# Patient Record
Sex: Female | Born: 1980 | Race: White | Hispanic: No | Marital: Single | Smoking: Never smoker
Health system: Southern US, Community
[De-identification: ages and names within clinical notes are randomized; demographics above are authoritative.]

## PROBLEM LIST (undated history)

## (undated) DIAGNOSIS — Z86711 Personal history of pulmonary embolism: Secondary | ICD-10-CM

## (undated) DIAGNOSIS — I872 Venous insufficiency (chronic) (peripheral): Secondary | ICD-10-CM

## (undated) DIAGNOSIS — E669 Obesity, unspecified: Secondary | ICD-10-CM

## (undated) DIAGNOSIS — E119 Type 2 diabetes mellitus without complications: Secondary | ICD-10-CM

## (undated) DIAGNOSIS — I10 Essential (primary) hypertension: Secondary | ICD-10-CM

## (undated) DIAGNOSIS — R002 Palpitations: Secondary | ICD-10-CM

## (undated) DIAGNOSIS — Z86718 Personal history of other venous thrombosis and embolism: Secondary | ICD-10-CM

## (undated) HISTORY — DX: Palpitations: R00.2

## (undated) HISTORY — DX: Personal history of pulmonary embolism: Z86.711

## (undated) HISTORY — DX: Obesity, unspecified: E66.9

## (undated) HISTORY — DX: Essential (primary) hypertension: I10

## (undated) HISTORY — DX: Type 2 diabetes mellitus without complications: E11.9

## (undated) HISTORY — DX: Venous insufficiency (chronic) (peripheral): I87.2

---

## 2017-12-19 ENCOUNTER — Emergency Department (HOSPITAL_BASED_OUTPATIENT_CLINIC_OR_DEPARTMENT_OTHER): Payer: Self-pay

## 2017-12-19 ENCOUNTER — Other Ambulatory Visit: Payer: Self-pay

## 2017-12-19 ENCOUNTER — Encounter (HOSPITAL_BASED_OUTPATIENT_CLINIC_OR_DEPARTMENT_OTHER): Payer: Self-pay | Admitting: Emergency Medicine

## 2017-12-19 ENCOUNTER — Emergency Department (HOSPITAL_BASED_OUTPATIENT_CLINIC_OR_DEPARTMENT_OTHER)
Admission: EM | Admit: 2017-12-19 | Discharge: 2017-12-19 | Disposition: A | Discharge status: Home / Self Care | Payer: Self-pay | Attending: Emergency Medicine | Admitting: Emergency Medicine

## 2017-12-19 DIAGNOSIS — I8003 Phlebitis and thrombophlebitis of superficial vessels of lower extremities, bilateral: Secondary | ICD-10-CM | POA: Insufficient documentation

## 2017-12-19 HISTORY — DX: Personal history of other venous thrombosis and embolism: Z86.718

## 2017-12-19 NOTE — ED Triage Notes (Addendum)
Patient states that she has a hx of blood clots and varicose veins  - patient recently flew from [place] to here. Patient states that she now has swelling nad pain and "more noticible veins" since she got back  - patient states that she has never needed to be on blood thinners they just tell her to take motrin. Patient also reports that she would like her right leg checked as she thinks "that one" is growing"

## 2017-12-19 NOTE — ED Provider Notes (Signed)
[facility] EMERGENCY DEPARTMENT Provider Note   CSN: [id number] Arrival date & time: 12/19/17  1607     History   Chief Complaint Chief Complaint  Patient presents with  . Leg Pain    HPI Sabrina Mclaughlin is a 37 y.o. female.  HPI Patient has history of thrombophlebitis in her lower legs.  The right leg has recently been getting more painful.  Never had DVT.  Recent flight to [place] and now both legs have more swelling both at painful varices and diffusely.  No fever.  No chest pain.  No trouble breathing.  Her trip was because her mother died unexpectedly. Past Medical History:  Diagnosis Date  . History of DVT (deep vein thrombosis)     There are no active problems to display for this patient.   History reviewed. No pertinent surgical history.  OB History    No data available       Home Medications    Prior to Admission medications   Not on File    Family History History reviewed. No pertinent family history.  Social History Social History   Tobacco Use  . Smoking status: Never Smoker  . Smokeless tobacco: Never Used  Substance Use Topics  . Alcohol use: No    Frequency: Never  . Drug use: No     Allergies   Patient has no known allergies.   Review of Systems Review of Systems  Constitutional: Negative for appetite change and fever.  Respiratory: Negative for shortness of breath.   Cardiovascular: Positive for leg swelling. Negative for chest pain.  Gastrointestinal: Negative for abdominal pain.  Genitourinary: Negative for flank pain.  Musculoskeletal: Negative for back pain.  Skin: Negative for rash.  Neurological: Negative for syncope.  Hematological: Does not bruise/bleed easily.  Psychiatric/Behavioral: Negative for confusion.     Physical Exam Updated Vital Signs BP (!) 144/94 (BP Location: Right Arm)   Pulse 90   Temp 99.2 F (37.3 C) (Oral)   Resp 18   Ht 5\' 5"  (1.651 m)   Wt 104.3 kg (230 lb)   LMP 11/26/2017    SpO2 98%   BMI 38.27 kg/m   Physical Exam  Constitutional: She appears well-developed.  HENT:  Head: Atraumatic.  Eyes: EOM are normal.  Cardiovascular: Normal rate.  Pulmonary/Chest: Effort normal.  Abdominal: There is no tenderness.  Musculoskeletal:  Some tender varicose veins on bilateral lower legs.  Bilateral.  Some darkening of the skin in the area.  Slight edema of the lower legs also.  Good capillary refill.  Skin: Skin is warm.     ED Treatments / Results  Labs (all labs ordered are listed, but only abnormal results are displayed) Labs Reviewed - No data to display  EKG  EKG Interpretation None       Radiology US Venous Img Lower Bilateral  Result Date: 12/19/2017 CLINICAL DATA:  Recent 5 hour plane flight, BILATERAL lower extremity pain, swelling, and redness below the knee for 3 weeks, history of deep venous thrombosis EXAM: BILATERAL LOWER EXTREMITY VENOUS DOPPLER ULTRASOUND TECHNIQUE: Gray-scale sonography with graded compression, as well as color Doppler and duplex ultrasound were performed to evaluate the lower extremity deep venous systems from the level of the common femoral vein and including the common femoral, femoral, profunda femoral, popliteal and calf veins including the posterior tibial, peroneal and gastrocnemius veins when visible. The superficial great saphenous vein was also interrogated. Spectral Doppler was utilized to evaluate flow at rest and with  distal augmentation maneuvers in the common femoral, femoral and popliteal veins. COMPARISON:  None. FINDINGS: RIGHT LOWER EXTREMITY Common Femoral Vein: No evidence of thrombus. Normal compressibility, respiratory phasicity and response to augmentation. Saphenofemoral Junction: No evidence of thrombus. Normal compressibility and flow on color Doppler imaging. Profunda Femoral Vein: No evidence of thrombus. Normal compressibility and flow on color Doppler imaging. Femoral Vein: No evidence of thrombus.  Normal compressibility, respiratory phasicity and response to augmentation. Popliteal Vein: No evidence of thrombus. Normal compressibility, respiratory phasicity and response to augmentation. Calf Veins: No evidence of thrombus. Normal compressibility and flow on color Doppler imaging. Superficial Great Saphenous Vein: No evidence of thrombus. Normal compressibility. Venous Reflux:  None. Other Findings: Thrombus is identified within dilated subcutaneous vein within the subcutaneous tissues of the lateral inferior RIGHT lower leg compatible with superficial thrombophlebitis. LEFT LOWER EXTREMITY Common Femoral Vein: No evidence of thrombus. Normal compressibility, respiratory phasicity and response to augmentation. Saphenofemoral Junction: No evidence of thrombus. Normal compressibility and flow on color Doppler imaging. Profunda Femoral Vein: No evidence of thrombus. Normal compressibility and flow on color Doppler imaging. Femoral Vein: No evidence of thrombus. Normal compressibility, respiratory phasicity and response to augmentation. Popliteal Vein: No evidence of thrombus. Normal compressibility, respiratory phasicity and response to augmentation. Calf Veins: No evidence of thrombus. Normal compressibility and flow on color Doppler imaging. Superficial Great Saphenous Vein: No evidence of thrombus. Normal compressibility. Venous Reflux:  None. Other Findings: Large thrombosed superficial venous varicosities are identified at the medial LEFT calf. IMPRESSION: No evidence of deep venous thrombosis in either lower extremity. Thrombosed superficial venous varicosities in the lower legs bilaterally compatible with superficial thrombophlebitis. Electronically Signed   By: Ulyses SouthwardMark  Boles M.D.   On: 12/19/2017 19:58    Procedures Procedures (including critical care time)  Medications Ordered in ED Medications - No data to display   Initial Impression / Assessment and Plan / ED Course  I have reviewed the  triage vital signs and the nursing notes.  Pertinent labs & imaging results that were available during my care of the patient were reviewed by me and considered in my medical decision making (see chart for details).     Patient presents with swelling in her legs.  History of superficial thrombophlebitis.  Doppler does show only superficial thrombophlebitis.  Doubt pulmonary embolism or DVT.  Will treat symptomatically with anti-inflammatories and warm compresses.  Discharge home.  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

## 2017-12-19 NOTE — ED Notes (Signed)
PT discharged to home with family. NAD. 

## 2021-05-31 ENCOUNTER — Other Ambulatory Visit: Payer: Self-pay

## 2021-05-31 ENCOUNTER — Emergency Department (HOSPITAL_BASED_OUTPATIENT_CLINIC_OR_DEPARTMENT_OTHER): Payer: Medicaid Other

## 2021-05-31 ENCOUNTER — Inpatient Hospital Stay (HOSPITAL_BASED_OUTPATIENT_CLINIC_OR_DEPARTMENT_OTHER)
Admission: EM | Admit: 2021-05-31 | Discharge: 2021-06-04 | DRG: 175 | Disposition: A | Discharge status: Home / Self Care | Payer: Medicaid Other | Attending: Internal Medicine | Admitting: Internal Medicine

## 2021-05-31 ENCOUNTER — Encounter (HOSPITAL_BASED_OUTPATIENT_CLINIC_OR_DEPARTMENT_OTHER): Payer: Self-pay | Admitting: *Deleted

## 2021-05-31 DIAGNOSIS — I2602 Saddle embolus of pulmonary artery with acute cor pulmonale: Secondary | ICD-10-CM | POA: Diagnosis not present

## 2021-05-31 DIAGNOSIS — E669 Obesity, unspecified: Secondary | ICD-10-CM | POA: Diagnosis present

## 2021-05-31 DIAGNOSIS — E876 Hypokalemia: Secondary | ICD-10-CM | POA: Diagnosis present

## 2021-05-31 DIAGNOSIS — I2609 Other pulmonary embolism with acute cor pulmonale: Secondary | ICD-10-CM

## 2021-05-31 DIAGNOSIS — J9601 Acute respiratory failure with hypoxia: Secondary | ICD-10-CM | POA: Diagnosis present

## 2021-05-31 DIAGNOSIS — I10 Essential (primary) hypertension: Secondary | ICD-10-CM | POA: Diagnosis present

## 2021-05-31 DIAGNOSIS — I824Y2 Acute embolism and thrombosis of unspecified deep veins of left proximal lower extremity: Secondary | ICD-10-CM | POA: Diagnosis present

## 2021-05-31 DIAGNOSIS — R7303 Prediabetes: Secondary | ICD-10-CM | POA: Diagnosis present

## 2021-05-31 DIAGNOSIS — I2699 Other pulmonary embolism without acute cor pulmonale: Secondary | ICD-10-CM | POA: Diagnosis present

## 2021-05-31 DIAGNOSIS — E079 Disorder of thyroid, unspecified: Secondary | ICD-10-CM | POA: Diagnosis present

## 2021-05-31 DIAGNOSIS — Z20822 Contact with and (suspected) exposure to covid-19: Secondary | ICD-10-CM | POA: Diagnosis present

## 2021-05-31 DIAGNOSIS — I82462 Acute embolism and thrombosis of left calf muscular vein: Secondary | ICD-10-CM | POA: Diagnosis present

## 2021-05-31 DIAGNOSIS — I959 Hypotension, unspecified: Secondary | ICD-10-CM | POA: Diagnosis present

## 2021-05-31 DIAGNOSIS — Z86718 Personal history of other venous thrombosis and embolism: Secondary | ICD-10-CM | POA: Diagnosis not present

## 2021-05-31 DIAGNOSIS — Z6837 Body mass index (BMI) 37.0-37.9, adult: Secondary | ICD-10-CM | POA: Diagnosis not present

## 2021-05-31 DIAGNOSIS — D72829 Elevated white blood cell count, unspecified: Secondary | ICD-10-CM | POA: Diagnosis present

## 2021-05-31 DIAGNOSIS — Z86711 Personal history of pulmonary embolism: Secondary | ICD-10-CM | POA: Diagnosis present

## 2021-05-31 HISTORY — DX: Hypokalemia: E87.6

## 2021-05-31 HISTORY — DX: Hypotension, unspecified: I95.9

## 2021-05-31 LAB — COMPREHENSIVE METABOLIC PANEL
ALT: 17 U/L (ref 0–44)
AST: 24 U/L (ref 15–41)
Albumin: 4.1 g/dL (ref 3.5–5.0)
Alkaline Phosphatase: 71 U/L (ref 38–126)
Anion gap: 13 (ref 5–15)
BUN: 20 mg/dL (ref 6–20)
CO2: 22 mmol/L (ref 22–32)
Calcium: 9 mg/dL (ref 8.9–10.3)
Chloride: 98 mmol/L (ref 98–111)
Creatinine, Ser: 0.92 mg/dL (ref 0.44–1.00)
GFR, Estimated: >60 mL/min (ref >60)
Glucose, Bld: 210 mg/dL — ABNORMAL HIGH (ref 70–99)
Potassium: 3.1 mmol/L — ABNORMAL LOW (ref 3.5–5.1)
Sodium: 133 mmol/L — ABNORMAL LOW (ref 135–145)
Total Bilirubin: 1.3 mg/dL — ABNORMAL HIGH (ref 0.3–1.2)
Total Protein: 8.4 g/dL — ABNORMAL HIGH (ref 6.5–8.1)

## 2021-05-31 LAB — CBC
HCT: 42 % (ref 36.0–46.0)
Hemoglobin: 14.8 g/dL (ref 12.0–15.0)
MCH: 31.6 pg (ref 26.0–34.0)
MCHC: 35.2 g/dL (ref 30.0–36.0)
MCV: 89.7 fL (ref 80.0–100.0)
Platelets: 375 10*3/uL (ref 150–400)
RBC: 4.68 MIL/uL (ref 3.87–5.11)
RDW: 11.9 % (ref 11.5–15.5)
WBC: 15.1 10*3/uL — ABNORMAL HIGH (ref 4.0–10.5)
nRBC: 0 % (ref 0.0–0.2)

## 2021-05-31 LAB — GLUCOSE, CAPILLARY: Glucose-Capillary: 167 mg/dL — ABNORMAL HIGH (ref 70–99)

## 2021-05-31 LAB — BRAIN NATRIURETIC PEPTIDE: B Natriuretic Peptide: 27.3 pg/mL (ref 0.0–100.0)

## 2021-05-31 LAB — PROTIME-INR
INR: 1 (ref 0.8–1.2)
Prothrombin Time: 13 seconds (ref 11.4–15.2)

## 2021-05-31 LAB — PREGNANCY, URINE: Preg Test, Ur: NEGATIVE

## 2021-05-31 LAB — RESP PANEL BY RT-PCR (FLU A&B, COVID) ARPGX2
Influenza A by PCR: NEGATIVE
Influenza B by PCR: NEGATIVE
SARS Coronavirus 2 by RT PCR: NEGATIVE

## 2021-05-31 LAB — APTT: aPTT: 24 seconds (ref 24–36)

## 2021-05-31 LAB — TROPONIN I (HIGH SENSITIVITY): Troponin I (High Sensitivity): 36 ng/L — ABNORMAL HIGH (ref <18)

## 2021-05-31 MED ORDER — ALTEPLASE 100 MG IV SOLR
INTRAVENOUS | Status: AC
  Filled 2021-05-31: qty 100

## 2021-05-31 MED ORDER — IOHEXOL 350 MG/ML SOLN
100.0000 mL | Freq: Once | INTRAVENOUS | Status: AC | PRN
  Administered 2021-05-31: 100 mL via INTRAVENOUS

## 2021-05-31 MED ORDER — SODIUM CHLORIDE 0.9 % IV BOLUS
1000.0000 mL | Freq: Once | INTRAVENOUS | Status: AC
  Administered 2021-05-31: 1000 mL via INTRAVENOUS

## 2021-05-31 MED ORDER — TENECTEPLASE 50 MG IV KIT
PACK | INTRAVENOUS | Status: AC
  Filled 2021-05-31: qty 10

## 2021-05-31 MED ORDER — HEPARIN (PORCINE) 25000 UT/250ML-% IV SOLN
1400.0000 [IU]/h | INTRAVENOUS | Status: DC
  Administered 2021-05-31: 20:00:00 1300 [IU]/h via INTRAVENOUS
  Filled 2021-05-31 (×2): qty 250

## 2021-05-31 MED ORDER — CHLORHEXIDINE GLUCONATE CLOTH 2 % EX PADS
6.0000 | MEDICATED_PAD | Freq: Every day | CUTANEOUS | Status: DC
  Administered 2021-05-31 – 2021-06-03 (×4): 6 via TOPICAL

## 2021-05-31 MED ORDER — SODIUM CHLORIDE 0.9 % IV SOLN: Freq: Once | INTRAVENOUS | Status: AC

## 2021-05-31 MED ORDER — NOREPINEPHRINE 4 MG/250ML-% IV SOLN
INTRAVENOUS | Status: AC
  Filled 2021-05-31: qty 250

## 2021-05-31 MED ORDER — HEPARIN BOLUS VIA INFUSION
5000.0000 [IU] | Freq: Once | INTRAVENOUS | Status: AC
  Administered 2021-05-31: 5000 [IU] via INTRAVENOUS

## 2021-05-31 NOTE — ED Notes (Signed)
Dr. Reese at bedside.

## 2021-05-31 NOTE — ED Notes (Signed)
ED Provider at bedside. 

## 2021-05-31 NOTE — ED Notes (Signed)
NS started wide open

## 2021-05-31 NOTE — ED Triage Notes (Signed)
C/o sudden onset of hear palpitations with SOB, chest pressure and nausea x 45 mins ago

## 2021-05-31 NOTE — ED Provider Notes (Signed)
[facility] EMERGENCY DEPARTMENT Provider Note   CSN: [id number] Arrival date & time: 05/31/21  1850     History Chief Complaint  Patient presents with   Loss of Consciousness    Sabrina Mclaughlin is a 40 y.o. female without significant past medical hx of superficial venous thrombosis who presents to the emergency department with complaints of syncopal episode which occurred about 1 hour prior to arrival.  Patient states that she was in the kitchen when she started to feel lightheaded with palpitations, shortness of breath, chest tightness, and nausea.  She went and sat down on the couch and subsequently passed out.  She did not hit her head.  She came back to with continued symptoms and diaphoresis prompting emergency department visit.  She states that she has had some bilateral lower extremity discomfort consistent with prior superficial venous thrombosis.  She has not had a prior DVT or PE or been on anticoagulation.  She denies fever, vomiting, hemoptysis, recent surgery/trauma, recent long travel, hormone use, or hx of cancer.    HPI     Past Medical History:  Diagnosis Date   History of DVT (deep vein thrombosis)     There are no problems to display for this patient.   History reviewed. No pertinent surgical history.   OB History   No obstetric history on file.     No family history on file.  Social History   Tobacco Use   Smoking status: Never   Smokeless tobacco: Never  Substance Use Topics   Alcohol use: No   Drug use: No    Home Medications Prior to Admission medications   Not on File    Allergies    Patient has no known allergies.  Review of Systems   Review of Systems  Constitutional:  Positive for diaphoresis. Negative for chills and fever.  Respiratory:  Positive for shortness of breath.   Cardiovascular:  Positive for chest pain and palpitations.  Gastrointestinal:  Positive for nausea. Negative for abdominal pain and vomiting.   Musculoskeletal:  Positive for myalgias.  Neurological:  Positive for syncope.  All other systems reviewed and are negative.  Physical Exam Updated Vital Signs BP (!) 127/94 (BP Location: Left Arm)   Pulse (!) 107   Temp 98.4 F (36.9 C) (Oral)   Resp (!) 24   Ht  (1.651 m)   Wt 96.2 kg   LMP 05/31/2021   SpO2 (!) 81%   BMI 35.28 kg/m   Physical Exam Vitals and nursing note reviewed.  Constitutional:      Appearance: She is ill-appearing.  HENT:     Head: Normocephalic and atraumatic.  Eyes:     Comments: PERRL.   Cardiovascular:     Rate and Rhythm: Regular rhythm. Tachycardia present.     Pulses:          Radial pulses are 2+ on the right side and 2+ on the left side.       Dorsalis pedis pulses are 2+ on the right side and 2+ on the left side.  Pulmonary:     Effort: Tachypnea present.     Breath sounds: No wheezing, rhonchi or rales.     Comments: Hypoxic into the 70s on RA- requiring 5L via [place] to maintain SpO2 @ 92%. Clear lungs.  Abdominal:     Tenderness: There is no abdominal tenderness. There is no guarding or rebound.  Musculoskeletal:     Comments: LE swelling bilaterally. No significant  calf tenderness.   Skin:    General: Skin is moist.     Comments: Diaphoretic.   Neurological:     Mental Status: She is alert.  Psychiatric:        Behavior: Behavior is cooperative.    ED Results / Procedures / Treatments   Labs (all labs ordered are listed, but only abnormal results are displayed) Labs Reviewed  CBC - Abnormal; Notable for the following components:      Result Value   WBC 15.1 (*)    All other components within normal limits  COMPREHENSIVE METABOLIC PANEL - Abnormal; Notable for the following components:   Sodium 133 (*)    Potassium 3.1 (*)    Glucose, Bld 210 (*)    Total Protein 8.4 (*)    Total Bilirubin 1.3 (*)    All other components within normal limits  TROPONIN I (HIGH SENSITIVITY) - Abnormal; Notable for the following  components:   Troponin I (High Sensitivity) 36 (*)    All other components within normal limits  RESP PANEL BY RT-PCR (FLU A&B, COVID) ARPGX2  APTT  PROTIME-INR  PREGNANCY, URINE  HEPARIN LEVEL (UNFRACTIONATED)    EKG EKG Interpretation  Date/Time:  Wednesday May 31 2021 19:07:54 EDT Ventricular Rate:  111 PR Interval:  166 QRS Duration: 99 QT Interval:  340 QTC Calculation: 462 R Axis:   64 Text Interpretation: Sinus tachycardia Borderline repolarization abnormality Confirmed by Tilden Fossa [phone]) on 05/31/2021 7:18:28 PM  Radiology CT Head Wo Contrast  Result Date: 05/31/2021 CLINICAL DATA:  Neck mass EXAM: CT HEAD WITHOUT CONTRAST TECHNIQUE: Contiguous axial images were obtained from the base of the skull through the vertex without intravenous contrast. COMPARISON:  CT chest 05/31/2021 FINDINGS: Brain: No evidence of acute infarction, hemorrhage, hydrocephalus, extra-axial collection or mass lesion/mass effect. Vascular: Limited assessment due to recent intravascular contrast administration. No unexpected calcification. Skull: Normal. Negative for fracture or focal lesion. Sinuses/Orbits: No acute finding. Other: None IMPRESSION: Negative non contrasted CT appearance of the brain. Electronically Signed   By: Jasmine Pang M.D.   On: 05/31/2021 21:33   CT Angio Chest PE W/Cm &/Or Wo Cm  Result Date: 05/31/2021 CLINICAL DATA:  Loss of consciousness EXAM: CT ANGIOGRAPHY CHEST WITH CONTRAST TECHNIQUE: Multidetector CT imaging of the chest was performed using the standard protocol during bolus administration of intravenous contrast. Multiplanar CT image reconstructions and MIPs were obtained to evaluate the vascular anatomy. CONTRAST:  OMNIPAQUE IOHEXOL 350 MG/ML SOLN COMPARISON:  Chest x-ray 05/31/2021 FINDINGS: Cardiovascular: Satisfactory opacification of the pulmonary arteries to the segmental level. Extensive bilateral acute pulmonary emboli, involves the distal main  pulmonary arteries bilaterally. Extension of thrombus into right upper lobe segmental vessels, inter lobar pulmonary artery, right middle lobe segmental vessels and right lower lobe segmental and subsegmental vessels. Thrombus on the left extends into left upper and lower lobe segmental and subsegmental vessels. Positive for right heart strain with RV LV ratio of 2. Left ventricular walls appear thickened. Cardiac size is normal. No pericardial effusion. Mediastinum/Nodes: Midline trachea. 4.3 cm heterogenous mass at the thyroid isthmus and right lobe. No suspicious adenopathy. Esophagus within normal limits Lungs/Pleura: Lungs are clear. No pleural effusion or pneumothorax. Upper Abdomen: No acute abnormality. Musculoskeletal: No chest wall abnormality. No acute or significant osseous findings. Review of the MIP images confirms the above findings. IMPRESSION: Positive for acute bilateral PE with CT evidence of right heart strain (RV/LV Ratio = 2.0) consistent with at least submassive (intermediate risk)  PE. The presence of right heart strain has been associated with an increased risk of morbidity and mortality. Please refer to the "PE Focused" order set in EPIC. 4.3 cm heterogenous thyroid mass. Recommend thyroid US (ref: J Am Coll Radiol. 2015 Feb;12(2): 143-50). Critical Value/emergent results were called by telephone at the time of interpretation on 05/31/2021 at 8:36 pm to provider [facility] , who verbally acknowledged these results. Electronically Signed   By: Jasmine Pang M.D.   On: 05/31/2021 20:36   US Venous Img Lower Bilateral  Result Date: 05/31/2021 CLINICAL DATA:  Bilateral lower extremity edema EXAM: BILATERAL LOWER EXTREMITY VENOUS DOPPLER ULTRASOUND TECHNIQUE: Gray-scale sonography with graded compression, as well as color Doppler and duplex ultrasound were performed to evaluate the lower extremity deep venous systems from the level of the common femoral vein and including the common  femoral, femoral, profunda femoral, popliteal and calf veins including the posterior tibial, peroneal and gastrocnemius veins when visible. The superficial great saphenous vein was also interrogated. Spectral Doppler was utilized to evaluate flow at rest and with distal augmentation maneuvers in the common femoral, femoral and popliteal veins. COMPARISON:  12/19/2017 FINDINGS: RIGHT LOWER EXTREMITY Common Femoral Vein: No evidence of thrombus. Normal compressibility, respiratory phasicity and response to augmentation. Saphenofemoral Junction: No evidence of thrombus. Normal compressibility and flow on color Doppler imaging. Profunda Femoral Vein: No evidence of thrombus. Normal compressibility and flow on color Doppler imaging. Femoral Vein: No evidence of thrombus. Normal compressibility, respiratory phasicity and response to augmentation. Popliteal Vein: No evidence of thrombus. Normal compressibility, respiratory phasicity and response to augmentation. Calf Veins: No evidence of thrombus. Normal compressibility and flow on color Doppler imaging. Superficial Great Saphenous Vein: No evidence of thrombus. Normal compressibility. Venous Reflux:  None. Other Findings:  None. LEFT LOWER EXTREMITY Common Femoral Vein: No evidence of thrombus. Normal compressibility, respiratory phasicity and response to augmentation. Saphenofemoral Junction: No evidence of thrombus. Normal compressibility and flow on color Doppler imaging. Profunda Femoral Vein: No evidence of thrombus. Normal compressibility and flow on color Doppler imaging. Femoral Vein: No evidence of thrombus. Normal compressibility, respiratory phasicity and response to augmentation. Popliteal Vein: No evidence of thrombus. Normal compressibility, respiratory phasicity and response to augmentation. Calf Veins: The left peroneal and posterior tibial veins are patent on the presented images. There is, however, nonocclusive thrombus seen within several gastrocnemius  veins of the left calf. Superficial Great Saphenous Vein: There is nonocclusive thrombus within the central greater saphenous vein roughly 12 mm from the saphenofemoral junction. There is no extension of this thrombus into the common femoral vein itself. There is occlusive, acute expansile thrombus within the greater saphenous vein slightly more distally within the proximal thigh. The greater saphenous vein remains thrombosed to at least the level of the knee. Venous Reflux:  None. Other Findings:  None. IMPRESSION: No femoropopliteal DVT identified. Superficial venous thrombosis of the greater saphenous vein to within 12 mm of the saphenofemoral junction. No extension of thrombus identified through the saphenofemoral junction itself. Infrapopliteal DVT involving the left gastrocnemius veins. No extension into the popliteal vein. Electronically Signed   By: Helyn Numbers MD   On: 05/31/2021 21:52   DG Chest Portable 1 View  Result Date: 05/31/2021 CLINICAL DATA:  Heart palpitation EXAM: PORTABLE CHEST 1 VIEW COMPARISON:  None. FINDINGS: The heart size and mediastinal contours are within normal limits. Both lungs are clear. The visualized skeletal structures are unremarkable. IMPRESSION: No active disease. Electronically Signed   By: Jasmine Pang  M.D.   On: 05/31/2021 19:38    Procedures .Critical Care  Date/Time: 05/31/2021 10:38 PM Performed by: Cherly AndersonPetrucelli, Deari Sessler R, PA-C Authorized by: Cherly AndersonPetrucelli, Debbe Crumble R, PA-C    CRITICAL CARE Performed by: Harvie HeckSamantha Lielle Vandervort   Total critical care time: 55 minutes  Critical care time was exclusive of separately billable procedures and treating other patients.  Critical care was necessary to treat or prevent imminent or life-threatening deterioration.  Critical care was time spent personally by me on the following activities: development of treatment plan with patient and/or surrogate as well as nursing, discussions with consultants, evaluation of  patient's response to treatment, examination of patient, obtaining history from patient or surrogate, ordering and performing treatments and interventions, ordering and review of laboratory studies, ordering and review of radiographic studies, pulse oximetry and re-evaluation of patient's condition.  Medications Ordered in ED Medications  heparin ADULT infusion 100 units/mL (25000 units/24150mL) (1,300 Units/hr Intravenous New Bag/Given 05/31/21 1944)  heparin bolus via infusion 5,000 Units (5,000 Units Intravenous Bolus from Bag 05/31/21 1944)  iohexol (OMNIPAQUE) 350 MG/ML injection 100 mL (100 mLs Intravenous Contrast Given 05/31/21 2006)  0.9 %  sodium chloride infusion ( Intravenous New Bag/Given 05/31/21 2202)  sodium chloride 0.9 % bolus 1,000 mL (0 mLs Intravenous Stopped 05/31/21 2155)    ED Course  I have reviewed the triage vital signs and the nursing notes.  Pertinent labs & imaging results that were available during my care of the patient were reviewed by me and considered in my medical decision making (see chart for details).    MDM Rules/Calculators/A&P                           Patient presents to the ED with complaints of syncope with palpitations, chest tightness, & dyspnea. On arrival patient patient is hypoxic, tachycardic, and tachypneic. Requiring 5L via [place] to maintain SPO2 > 92% on RA. Clinical concern for pulmonary embolism, appropriate labs and imaging ordered.  Patient quickly staffed with supervising physician Dr. Madilyn Hookees who is in agreement with starting heparin. Additional DDX- acs, pneumothorax, pneumonia, covid, arrhythmia, dissection.   Additional history obtained:  Additional history obtained from chart review & nursing note review.   19:34: CONSULT: Discussed with RPH John- heparin dosing being placed now.   19:40: Heparin being started at this time.   EKG: Sinus tachycardia Borderline repolarization abnormality Lab Tests:  I Ordered, reviewed, and interpreted  labs, which included:  CBC: Leukocytosis at 15.1 CMP: Mild hypokalemia, hyponatremia, and hyperglycemia.  T bili is also mildly elevated. PT/INR/APTT: Within normal limits Troponin: Elevated at 36.  Imaging Studies ordered:  I ordered imaging studies which included CXR, CTA of the chest, & bilateral venous duplexes, I independently reviewed, formal radiology impression as below and above.   CXR: No active disease.   19:53: RE-EVAL: Patient became lightheaded, blood pressure dropped to 81/54, 1 L normal saline bolus started.  Patient escalated to 6 L via nasal cannula given sats in the 90s on 5 L.  Repeat blood pressure is improved.  Patient to be taken to CT scan at this time.  Myself and attending Dr. Madilyn Hookees personally reviewed CT imaging, findings of large pulmonary emboli and thyroid mass.  Consult placed to critical care.  20:32: CONSULT: Attending Dr. Madilyn Hookees spoke with intensivist Dr. Alvin CritchleyJeong-accepts admission, can hold off on tPA at this time, we [facility].  CTA: Positive for  acute bilateral PE with CT evidence of right heart strain (RV/LV Ratio = 2.0) consistent with at least submassive (intermediate risk) PE. The presence of right heart strain has been associated with an increased risk of morbidity and mortality. Please refer to the "PE Focused" order set in EPIC. 4.3 cm heterogenous thyroid mass. Recommend thyroid  Venous duplex studies: No femoropopliteal DVT identified. Superficial venous thrombosis of the greater saphenous vein to within 12 mm of the saphenofemoral junction. No extension of thrombus identified through the saphenofemoral junction itself. Infrapopliteal DVT involving the left gastrocnemius veins. No extension into the popliteal vein  20:49: CONSULT: Re-discussed with Dr. Ardeth Perfect- recommends head CT if able to obtain prior to transfer to evaluate for any brain masses/mets if patient potentially received tpa. Confirmed with  secretary that [facility] has not been sent for pick up yet, radiology subsequently informed of CT head order.   Patient's blood pressure is improving CT head wo: Negative non contrasted CT appearance of the brain.   Blood pressure (!) 138/102, pulse (!) 109, temperature 98.5 F (36.9 C), temperature source Oral, resp. rate (!) 26, height 5\' 5"  (1.651 m), weight 96.2 kg, last menstrual period 05/31/2021, SpO2 100 %.  22:09: [facility] in the ED for transport.   This is a shared visit with supervising physician Dr. 06/02/2021 who has independently evaluated patient & provided guidance in evaluation/management/disposition, in agreement with care   Portions of this note were generated with Dragon dictation software. Dictation errors may occur despite best attempts at proofreading.  Final Clinical Impression(s) / ED Diagnoses Final diagnoses:  Acute pulmonary embolism with acute cor pulmonale, unspecified pulmonary embolism type [facility])    Rx / DC Orders ED Discharge Orders     None        [facility] 05/31/21 2239    2240, MD 06/02/21 [phone]

## 2021-05-31 NOTE — Progress Notes (Signed)
eLink Physician-Brief Progress Note Patient Name: Sabrina Mclaughlin DOB: [birth year] MRN: [id number]   Date of Service  05/31/2021  HPI/Events of Note  Patient admitted via ED following a syncopal episode at home, CTA chest positive for hemodynamically significant bilateral PE  eICU Interventions  New Patient Evaluation, Heparin gtt infusing.        Migdalia Dk 05/31/2021, 11:33 PM

## 2021-05-31 NOTE — Progress Notes (Signed)
Patient's SPO2 is 90% on 6 liter nasal cannula.  Placed patient on 100% non rebreather.  Patient's SPO2 increased to 100%.  Patient states that she feel better with non rebreather on.  RT will continue to monitor.

## 2021-05-31 NOTE — Progress Notes (Signed)
Patient' SPO2 81% in Triage.  Placed patient on 6 liter nasal cannula with humidity.  SPO2 increased to 98%.  RT will continue to monitor.

## 2021-05-31 NOTE — Progress Notes (Signed)
ANTICOAGULATION CONSULT NOTE - Initial Consult  Pharmacy Consult for heparin Indication: pulmonary embolus  No Known Allergies  Patient Measurements: Height: 5\' 5"  (165.1 cm) Weight: 96.2 kg (212 lb) IBW/kg (Calculated) : 57 Heparin Dosing Weight: 78.7kg  Vital Signs: Temp: 98.4 F (36.9 C) (07/20 1858) Temp Source: Oral (07/20 1858) BP: 127/94 (07/20 1858) Pulse Rate: 107 (07/20 1858)  Labs: Recent Labs    05/31/21 1912  HGB 14.8  HCT 42.0  PLT 375  APTT 24  LABPROT 13.0  INR 1.0    CrCl cannot be calculated (No successful lab value found.).   Medical History: Past Medical History:  Diagnosis Date   History of DVT (deep vein thrombosis)     Assessment: 34 YOF presenting with SOB, CP, suspect PE and pharmacy consulted to initiate heparin gtt for PE r/o.  She is not on anticoagulation PTA  Goal of Therapy:  Heparin level 0.3-0.7 units/ml Monitor platelets by anticoagulation protocol: Yes   Plan:  Heparin 5000 units IV x 1, and gtt at 1300 units/hr F/u 6 hour heparin level F/u PE workup and long term [facility] plan  [facility], PharmD Clinical Pharmacist ED Pharmacist Phone # [phone] 05/31/2021 7:37 PM

## 2021-06-01 ENCOUNTER — Other Ambulatory Visit (HOSPITAL_COMMUNITY): Payer: Medicaid Other

## 2021-06-01 ENCOUNTER — Inpatient Hospital Stay (HOSPITAL_COMMUNITY): Payer: Medicaid Other

## 2021-06-01 DIAGNOSIS — E876 Hypokalemia: Secondary | ICD-10-CM

## 2021-06-01 DIAGNOSIS — E079 Disorder of thyroid, unspecified: Secondary | ICD-10-CM

## 2021-06-01 DIAGNOSIS — J9601 Acute respiratory failure with hypoxia: Secondary | ICD-10-CM

## 2021-06-01 DIAGNOSIS — I2609 Other pulmonary embolism with acute cor pulmonale: Secondary | ICD-10-CM

## 2021-06-01 DIAGNOSIS — I2602 Saddle embolus of pulmonary artery with acute cor pulmonale: Secondary | ICD-10-CM

## 2021-06-01 LAB — URINALYSIS, ROUTINE W REFLEX MICROSCOPIC
Bilirubin Urine: NEGATIVE
Glucose, UA: NEGATIVE mg/dL
Ketones, ur: 20 mg/dL — AB
Leukocytes,Ua: NEGATIVE
Nitrite: NEGATIVE
Protein, ur: NEGATIVE mg/dL
Specific Gravity, Urine: 1.045 — ABNORMAL HIGH (ref 1.005–1.030)
pH: 6 (ref 5.0–8.0)

## 2021-06-01 LAB — CBC
HCT: 37.1 % (ref 36.0–46.0)
HCT: 41.7 % (ref 36.0–46.0)
Hemoglobin: 13.2 g/dL (ref 12.0–15.0)
Hemoglobin: 14 g/dL (ref 12.0–15.0)
MCH: 32.1 pg (ref 26.0–34.0)
MCH: 31.5 pg (ref 26.0–34.0)
MCHC: 35.6 g/dL (ref 30.0–36.0)
MCHC: 33.6 g/dL (ref 30.0–36.0)
MCV: 90.3 fL (ref 80.0–100.0)
MCV: 93.7 fL (ref 80.0–100.0)
Platelets: 293 10*3/uL (ref 150–400)
Platelets: 268 10*3/uL (ref 150–400)
RBC: 4.11 MIL/uL (ref 3.87–5.11)
RBC: 4.45 MIL/uL (ref 3.87–5.11)
RDW: 12.1 % (ref 11.5–15.5)
RDW: 12.4 % (ref 11.5–15.5)
WBC: 11 10*3/uL — ABNORMAL HIGH (ref 4.0–10.5)
WBC: 17.7 10*3/uL — ABNORMAL HIGH (ref 4.0–10.5)
nRBC: 0 % (ref 0.0–0.2)
nRBC: 0 % (ref 0.0–0.2)

## 2021-06-01 LAB — PROTIME-INR
INR: 1.5 — ABNORMAL HIGH (ref 0.8–1.2)
Prothrombin Time: 17.9 seconds — ABNORMAL HIGH (ref 11.4–15.2)

## 2021-06-01 LAB — BASIC METABOLIC PANEL
Anion gap: 8 (ref 5–15)
BUN: 12 mg/dL (ref 6–20)
CO2: 22 mmol/L (ref 22–32)
Calcium: 8.4 mg/dL — ABNORMAL LOW (ref 8.9–10.3)
Chloride: 107 mmol/L (ref 98–111)
Creatinine, Ser: 0.72 mg/dL (ref 0.44–1.00)
GFR, Estimated: >60 mL/min (ref >60)
Glucose, Bld: 146 mg/dL — ABNORMAL HIGH (ref 70–99)
Potassium: 3.7 mmol/L (ref 3.5–5.1)
Sodium: 137 mmol/L (ref 135–145)

## 2021-06-01 LAB — ECHOCARDIOGRAM COMPLETE
Height: 65 in
S' Lateral: 2.7 cm
Weight: 3502.67 oz

## 2021-06-01 LAB — LACTIC ACID, PLASMA
Lactic Acid, Venous: 1.6 mmol/L (ref 0.5–1.9)
Lactic Acid, Venous: 1.5 mmol/L (ref 0.5–1.9)

## 2021-06-01 LAB — TROPONIN I (HIGH SENSITIVITY): Troponin I (High Sensitivity): 744 ng/L (ref <18)

## 2021-06-01 LAB — MAGNESIUM: Magnesium: 2.3 mg/dL (ref 1.7–2.4)

## 2021-06-01 LAB — HEMOGLOBIN A1C
Hgb A1c MFr Bld: 5.8 % — ABNORMAL HIGH (ref 4.8–5.6)
Mean Plasma Glucose: 119.76 mg/dL

## 2021-06-01 LAB — HEPARIN LEVEL (UNFRACTIONATED)
Heparin Unfractionated: 0.33 IU/mL (ref 0.30–0.70)
Heparin Unfractionated: 0.22 IU/mL — ABNORMAL LOW (ref 0.30–0.70)

## 2021-06-01 LAB — MRSA NEXT GEN BY PCR, NASAL: MRSA by PCR Next Gen: NOT DETECTED

## 2021-06-01 LAB — HIV ANTIBODY (ROUTINE TESTING W REFLEX): HIV Screen 4th Generation wRfx: NONREACTIVE

## 2021-06-01 LAB — APTT: aPTT: 54 seconds — ABNORMAL HIGH (ref 24–36)

## 2021-06-01 MED ORDER — NOREPINEPHRINE 4 MG/250ML-% IV SOLN
INTRAVENOUS | Status: AC
  Filled 2021-06-01: qty 250

## 2021-06-01 MED ORDER — POTASSIUM CHLORIDE IN NACL 20-0.9 MEQ/L-% IV SOLN
INTRAVENOUS | Status: DC
  Filled 2021-06-01: qty 1000

## 2021-06-01 MED ORDER — ALTEPLASE (PULMONARY EMBOLISM) INFUSION
100.0000 mg | Freq: Once | INTRAVENOUS | Status: AC
  Administered 2021-06-01: 100 mg via INTRAVENOUS
  Filled 2021-06-01: qty 100

## 2021-06-01 MED ORDER — SODIUM CHLORIDE 0.9 % IV SOLN
250.0000 mL | Freq: Once | INTRAVENOUS | Status: AC
  Administered 2021-06-01: 250 mL via INTRAVENOUS

## 2021-06-01 MED ORDER — POTASSIUM CHLORIDE CRYS ER 20 MEQ PO TBCR
20.0000 meq | EXTENDED_RELEASE_TABLET | Freq: Once | ORAL | Status: AC
  Administered 2021-06-01: 20 meq via ORAL
  Filled 2021-06-01: qty 1

## 2021-06-01 MED ORDER — FAMOTIDINE IN NACL 20-0.9 MG/50ML-% IV SOLN
20.0000 mg | Freq: Two times a day (BID) | INTRAVENOUS | Status: DC
  Administered 2021-06-01 (×3): 20 mg via INTRAVENOUS
  Filled 2021-06-01 (×3): qty 50

## 2021-06-01 MED ORDER — ONDANSETRON HCL 4 MG/2ML IJ SOLN
4.0000 mg | Freq: Four times a day (QID) | INTRAMUSCULAR | Status: DC | PRN
  Administered 2021-06-01: 4 mg via INTRAVENOUS
  Filled 2021-06-01: qty 2

## 2021-06-01 MED ORDER — POTASSIUM CHLORIDE 10 MEQ/100ML IV SOLN
10.0000 meq | INTRAVENOUS | Status: AC
Start: 2021-06-01 — End: 2021-06-01
  Administered 2021-06-01 (×2): 10 meq via INTRAVENOUS
  Filled 2021-06-01: qty 100

## 2021-06-01 MED ORDER — HYDRALAZINE HCL 20 MG/ML IJ SOLN
10.0000 mg | Freq: Four times a day (QID) | INTRAMUSCULAR | Status: DC | PRN
  Administered 2021-06-02 – 2021-06-03 (×4): 10 mg via INTRAVENOUS
  Filled 2021-06-01 (×4): qty 1

## 2021-06-01 MED ORDER — HEPARIN (PORCINE) 25000 UT/250ML-% IV SOLN
1550.0000 [IU]/h | INTRAVENOUS | Status: DC
  Administered 2021-06-01: 1400 [IU]/h via INTRAVENOUS
  Filled 2021-06-01: qty 250

## 2021-06-01 MED ORDER — DOCUSATE SODIUM 100 MG PO CAPS: 100.0000 mg | ORAL_CAPSULE | Freq: Two times a day (BID) | ORAL | Status: DC | PRN

## 2021-06-01 MED ORDER — ENALAPRILAT 1.25 MG/ML IV SOLN
0.6250 mg | Freq: Four times a day (QID) | INTRAVENOUS | Status: DC | PRN
  Administered 2021-06-01: 0.625 mg via INTRAVENOUS
  Filled 2021-06-01 (×2): qty 0.5

## 2021-06-01 MED ORDER — POLYETHYLENE GLYCOL 3350 17 G PO PACK: 17.0000 g | PACK | Freq: Every day | ORAL | Status: DC | PRN

## 2021-06-01 NOTE — Progress Notes (Signed)
IR Brief Update  After hypotensive episode and t-PA infusion this morning, the patient's work of breathing, tachycardia, and oxygen requirement have significantly improved.  At this time, no strong indication to pursue catheter directed therapy.  Agree with continued anticoagulation.  If the patient's status were to worsen or fail to continue to improve, thrombectomy could again be considered.  This was discussed with the patient who is in agreement.  Marliss Coots, MD Pager: [phone]

## 2021-06-01 NOTE — Progress Notes (Signed)
ANTICOAGULATION CONSULT NOTE   Pharmacy Consult for heparin Indication: pulmonary embolus  No Known Allergies  Patient Measurements: Height: 5\' 5"  (165.1 cm) Weight: 99.3 kg (218 lb 14.7 oz) IBW/kg (Calculated) : 57 Heparin Dosing Weight: 78.7kg  Vital Signs: Temp: 98 F (36.7 C) (07/20 2304) Temp Source: Oral (07/20 2304) BP: 161/137 (07/21 0400) Pulse Rate: 98 (07/21 0400)  Labs: Recent Labs    05/31/21 1912 06/01/21 0552  HGB 14.8 13.2  HCT 42.0 37.1  PLT 375 293  APTT 24  --   LABPROT 13.0  --   INR 1.0  --   HEPARINUNFRC  --  0.33  CREATININE 0.92  --   TROPONINIHS 36*  --      Estimated Creatinine Clearance: 92.9 mL/min (by C-G formula based on SCr of 0.92 mg/dL).   Medical History: Past Medical History:  Diagnosis Date   History of DVT (deep vein thrombosis)     Assessment: 40 YOF on heparin for PE. CT shows b/l PE with RHS. Heparin level 0.33 (therapeutic but at low end of range and would like at least near middle with large clots). No bleeding noted. CBC stable.  Goal of Therapy:  Heparin level 0.3-0.7 units/ml Monitor platelets by anticoagulation protocol: Yes   Plan:  Increase heparin to 1400 units/hr F/u 6hr heparin level  06/03/21, PharmD, BCPS Please see amion for complete clinical pharmacist phone list 06/01/2021 6:46 AM

## 2021-06-01 NOTE — Consult Note (Signed)
Chief Complaint: Patient was seen in consultation today for acute pulmonary embolism  Referring Physician(s): Marcelle Smiling, MD  Patient Status: [facility] - In-pt  History of Present Illness: Sabrina Mclaughlin is a 40 y.o. 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  CTA chest demonstrated bilateral distal main and lobar acute pulmonary emboli with associated RV:LV = 2.0.  Lower extremity duplex was significant for left calf vein DVT and chronic appearing thigh GSV thrombosis.  She was transferred to [facility] overnight, and since arrival she endorses feeling better.  She states that it is easier to breathe, but hurts to take a deep breath.  She endorses intermittent palpitations and a soreness in her chest.  She has gotten up to use the bathroom, but not ambulated since arrival.  Hypotension has resolve with crystalloid resuscitation and she has remained hypertensive since arrival.  She is a non-smoker, no recent travel or prolonged immobilization,  no known hypercoaguable disorder,     Past Medical History:  Diagnosis Date   History of DVT (deep vein thrombosis)     History reviewed. No pertinent surgical history.  Allergies: Patient has no known allergies.  Medications: Prior to Admission medications   Not on File     History reviewed. No pertinent family history.  Social History   Socioeconomic History   Marital status: Married    Spouse name: Not on file   Number of children: Not on file   Years of education: Not on file   Highest education level: Not on file  Occupational History   Not on file  Tobacco Use   Smoking status: Never   Smokeless tobacco: Never  Substance and Sexual Activity   Alcohol use: No   Drug use: No   Sexual activity: Not on file  Other Topics Concern    Not on file  Social History Narrative   Not on file   Social Determinants of Health   Financial Resource Strain: Not on file  Food Insecurity: Not on file  Transportation Needs: Not on file  Physical Activity: Not on file  Stress: Not on file  Social Connections: Not on file    Review of Systems: A 12 point ROS discussed and pertinent positives are indicated in the HPI above.  All other systems are negative.    Vital Signs: BP (!) 133/113   Pulse 92   Temp 98 F (36.7 C) (Oral)   Resp (!) 22   Ht 5\' 5"  (1.651 m)   Wt 99.3 kg   LMP 05/31/2021   SpO2 99%   BMI 36.43 kg/m   Physical Exam Constitutional:      General: She is not in acute distress. HENT:     Head: Normocephalic.     Mouth/Throat:     Mouth: Mucous membranes are moist.  Cardiovascular:     Rate and Rhythm: Regular rhythm. Tachycardia present.  Pulmonary:     Effort: No respiratory distress.     Comments: Non-rebreather on Abdominal:     General: There is no distension.  Musculoskeletal:     Cervical back: Neck supple.     Right lower leg: No edema.     Left lower leg: No edema.  Neurological:     Mental Status: She is alert and oriented to person, place, and time.    Imaging: CTPA 05/31/21  Labs:  CBC: Recent Labs    05/31/21 1912 06/01/21 0552  WBC 15.1* 11.0*  HGB 14.8 13.2  HCT 42.0 37.1  PLT 375 293    COAGS: Recent Labs    05/31/21 1912  INR 1.0  APTT 24    BMP: Recent Labs    05/31/21 1912  NA 133*  K 3.1*  CL 98  CO2 22  GLUCOSE 210*  BUN 20  CALCIUM 9.0  CREATININE 0.92  GFRNONAA >60    LIVER FUNCTION TESTS: Recent Labs    05/31/21 1912  BILITOT 1.3*  AST 24  ALT 17  ALKPHOS 71  PROT 8.4*  ALBUMIN 4.1   Trop 36 BNP 20   Assessment and Plan:  40 year old female with acute, seemingly unprovoked pulmonary emboli and left lower extremity calf deep vein thrombosis.  PESI score 112 on presentation (high risk), sPESI 1 (high risk), ESC  Intermediate High Risk PE.  While symptoms have improved on heparin infusion and supplemental oxygen, she remains tachycardic >110 and requiring non-rebreather.    Potential treatment options including anticoagulation alone, catheter directed therapies including thrombolysis and thrombectomy, and systemic fibrinolytic therapy were discussed.    She is an appropriate candidate for mechanical thrombectomy, and amenable to proceed with this plan.  Case discussed with Oscar La, MD, PCCM.     Thank you for this interesting consult.  I greatly enjoyed meeting Sabrina Mclaughlin and look forward to participating in their care.  A copy of this report was sent to the requesting provider on this date.  Electronically Signed: Bennie Dallas, MD 06/01/2021, 7:04 AM   I spent a total of 40 Minutes in face to face in clinical consultation, greater than 50% of which was counseling/coordinating care for acute pulmonary embolism.

## 2021-06-01 NOTE — Progress Notes (Signed)
Date and time results received: 06/01/21 800 Test: Tropnin Critical Value: 744 Name of Provider Notified: Chestine Spore, DO Continue plan of care

## 2021-06-01 NOTE — H&P (Addendum)
[facility] [place]:  Sabrina Mclaughlin MRN:  [id number] DOB:  06-Nov-1981 LOS: 1 ADMISSION DATE:  05/31/2021 DATE OF SERVICE:  06/01/2021  CHIEF COMPLAINT:  dyspnea, palpitations   HISTORY & PHYSICAL  History of Present Illness  This 40 y.o. Caucasian female non-smoker presented to [facility] with complaints of dyspnea and palpitations. [facility], she also reported experiencing near-syncopal symptoms prior to presentation.  SpO2 on room air was 80%.  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  She reports "feeling them moving up her leg" over the past week but neglected to present for medical attention until today.  At [facility], she was diagnosed with bilateral pulmonary emboli.  She was hypotensive there but responded favorably to IV fluid bolus and, in fact, is hypertensive upon transfer to [facility].  No recent trauma/surgery or long distance travel.  She has no previous history of deep vein thrombosis and denies family history of venous thromboembolism or hypercoagulability.  She was incidentally found to have a thyroid mass on CTA today, which she states she has been aware of in the past (no diagnostic evaluation).  REVIEW OF SYSTEMS Constitutional: Diaphoresis, resolved. No weight loss. No night sweats. No fever. No chills. No fatigue. HEENT: Lightheadedness, better now. No headaches, dysphagia, sore throat, otalgia, nasal congestion, PND CV:  Chest tightness, better now.  Palpitations.  No orthopnea, PND, swelling in lower extremities. GI:  Nausea but no vomiting. No abdominal pain, diarrhea, change in bowel pattern, anorexia. Resp: No DOE, rest dyspnea, cough, mucus, hemoptysis, wheezing  GU: no dysuria, change in color of urine, no urgency or frequency.  No flank pain. MS:  B LE discomfort. No joint pain or swelling. No myalgias,  No decreased range of motion.  Psych:  No change in mood or affect. No memory loss. Skin: no rash or  lesions.   Past Medical/Surgical/Social/Family History   Past Medical History:  Diagnosis Date   History of DVT (deep vein thrombosis)     History reviewed. No pertinent surgical history.  Social History   Tobacco Use   Smoking status: Never   Smokeless tobacco: Never  Substance Use Topics   Alcohol use: No    History reviewed. No pertinent family history.   Procedures:     Significant Diagnostic Tests:  7/20: chest CTA showed bilateral pulmonary emboli and thyroid mass.   Micro Data:   Results for orders placed or performed during the hospital encounter of 05/31/21  Resp Panel by RT-PCR (Flu A&B, Covid) Nasopharyngeal Swab     Status: None   Collection Time: 05/31/21  7:47 PM   Specimen: Nasopharyngeal Swab; Nasopharyngeal(NP) swabs in vial transport medium  Result Value Ref Range Status   SARS Coronavirus 2 by RT PCR NEGATIVE NEGATIVE Final    Comment: (NOTE) SARS-CoV-2 target nucleic acids are NOT DETECTED.  The SARS-CoV-2 RNA is generally detectable in upper respiratory specimens during the acute phase of infection. The lowest concentration of SARS-CoV-2 viral copies this assay can detect is 138 copies/mL. A negative result does not preclude SARS-Cov-2 infection and should not be used as the sole basis for treatment or other patient management decisions. A negative result may occur with  improper specimen collection/handling, submission of specimen other than nasopharyngeal swab, presence of viral mutation(s) within the areas targeted by this assay, and inadequate number of viral copies(<138 copies/mL). A negative result must be combined with clinical observations, patient history, and epidemiological information. The expected result  is Negative.  Fact Sheet for Patients:  BloggerCourse.com  Fact Sheet for Healthcare Providers:  SeriousBroker.it  This test is no t yet approved or cleared by the [place] FDA and  has been authorized for detection and/or diagnosis of SARS-CoV-2 by FDA under an Emergency Use Authorization (EUA). This EUA will remain  in effect (meaning this test can be used) for the duration of the COVID-19 declaration under Section 564(b)(1) of the Act, 21 U.S.C.section 360bbb-3(b)(1), unless the authorization is terminated  or revoked sooner.       Influenza A by PCR NEGATIVE NEGATIVE Final   Influenza B by PCR NEGATIVE NEGATIVE Final    Comment: (NOTE) The Xpert Xpress SARS-CoV-2/FLU/RSV plus assay is intended as an aid in the diagnosis of influenza from Nasopharyngeal swab specimens and should not be used as a sole basis for treatment. Nasal washings and aspirates are unacceptable for Xpert Xpress SARS-CoV-2/FLU/RSV testing.  Fact Sheet for Patients: BloggerCourse.com  Fact Sheet for Healthcare Providers: SeriousBroker.it  This test is not yet approved or cleared by the [place] FDA and has been authorized for detection and/or diagnosis of SARS-CoV-2 by FDA under an Emergency Use Authorization (EUA). This EUA will remain in effect (meaning this test can be used) for the duration of the COVID-19 declaration under Section 564(b)(1) of the Act, 21 U.S.C. section 360bbb-3(b)(1), unless the authorization is terminated or revoked.  Performed at [facility], [address] [place], [place] [address]       Antimicrobials:      Interim history/subjective:     Objective   BP (!) 147/110   Pulse (!) 103   Temp 98 F (36.7 C) (Oral)   Resp (!) 23   Ht 5\' 5"  (1.651 m)   Wt 99.3 kg   LMP 05/31/2021   SpO2 96%   BMI 36.43 kg/m     Filed Weights   05/31/21 1903 05/31/21 2300  Weight: 96.2 kg 99.3 kg    Intake/Output Summary (Last 24 hours) at 06/01/2021 0024 Last data filed at 06/01/2021 0000 Gross per 24 hour  Intake 1465.08 ml  Output --  Net 1465.08 ml    FiO2 (%):   [100 %] 100 %   Examination: GENERAL:  alert, oriented to time, person and place, pleasant, well-developed. No acute distress. HEAD: normocephalic, atraumatic EYE: PERRLA, EOM intact, no scleral icterus, no pallor. NOSE: nares are patent. No polyps. No exudate. No sinus tenderness. THROAT/ORAL CAVITY: Normal dentition. No oral thrush. No exudate. Mucous membranes are moist. No tonsillar enlargement.  NECK: supple, thyromegaly, no JVD, no lymphadenopathy. Trachea midline. CHEST/LUNG: symmetric in development and expansion. Good air entry. No crackles. No wheezes. HEART: Regular S1 and S2 without murmur, rub or gallop. ABDOMEN: soft, nontender, nondistended. Normoactive bowel sounds. No rebound. No guarding. No hepatosplenomegaly. EXTREMITIES: Edema: Trace. No cyanosis. No clubbing. 2+ DP pulses LYMPHATIC: no cervical/axillary/inguinal lymph nodes appreciated MUSCULOSKELETAL: No point tenderness. No bulk atrophy. Joints: normal inspection.  SKIN:  No rash or lesion. NEUROLOGIC: Doll's eyes intact. Corneal reflex intact. Spontaneous respirations intact. Cranial nerves II-XII are grossly symmetric and physiologic. Babinski absent. No sensory deficit. Motor: 5/5 @ RUE, 5/5 @ LUE, 5/5 @ RLL,  5/5 @ LLL.  DTR: 2+ @ R biceps, 2+ @ L biceps, 2+ @ R patellar,  2+ @ L patellar. No cerebellar signs. Gait was not assessed.   Resolved Hospital Problem list      Assessment & Plan:   ASSESSMENT/PLAN:  ASSESSMENT (  included in the Hospital Problem List)  Principal Problem:   Acute hypoxemic respiratory failure (HCC) Active Problems:   Pulmonary embolism (HCC)   Hypotension   Thyroid mass   Hypokalemia   By systems: PULMONARY Acute hypoxemic respiratory failure Acute pulmonary embolism Case was discussed with IR for consideration of catheter-directed thrombolytic therapy; however, in light of the patient's significant hemodynamic improvement, I have advised the patient that we should continue  with therapeutic anticoagulation only for now. Continue heparin infusion. Titrate supplemental oxygen to maintain SpO2 98+%. She will eventually need evaluation of thyroid to rule out malignancy.   CARDIOVASCULAR Hypertension, initially hypotensive Hemodynamic monitoring per ICU protocol. Not hypertensive at baseline.   RENAL Hypokalemia Replete UA   GASTROINTESTINAL GI PROPHYLAXIS: famotidine   HEMATOLOGIC Leukocytosis, likely demargination History of superficial venous thrombosis DVT PROPHYLAXIS: heparin   INFECTIOUS: No acute issues Monitor fever curve and WBC count   ENDOCRINE Hyperglycemia without history of diabetes mellitus Check HgbA1c.   NEUROLOGIC: No acute issues   PLAN/RECOMMENDATIONS  Admit to ICU under my service (Attending: Marcelle Smiling, MD) with the diagnoses highlighted above in the active Hospital Problem List (ASSESSMENT). IV fluids: NS + 20 mEq Kcl @ 75 mL/hr Meds: See orders and plan above NUTRITION: regular    My assessment, plan of care, findings, medications, side effects, etc. were discussed with: nurse and patient (answered all questions to patient's satisfaction).   Best practice:  Diet: regular Pain/Anxiety/Delirium protocol (if indicated): N/A VAP protocol (if indicated): N/A DVT prophylaxis: heparin GI prophylaxis: famotidine Glucose control: check HgbA1c Mobility/Activity: bedrest for now   Code Status: Full Code Family Communication:   no family at bedside Disposition: admit to ICU   Labs   CBC: Recent Labs  Lab 05/31/21 1912  WBC 15.1*  HGB 14.8  HCT 42.0  MCV 89.7  PLT 375    Basic Metabolic Panel: Recent Labs  Lab 05/31/21 1912  NA 133*  K 3.1*  CL 98  CO2 22  GLUCOSE 210*  BUN 20  CREATININE 0.92  CALCIUM 9.0   GFR: Estimated Creatinine Clearance: 92.9 mL/min (by C-G formula based on SCr of 0.92 mg/dL). Recent Labs  Lab 05/31/21 1912  WBC 15.1*    Liver Function Tests: Recent Labs  Lab  05/31/21 1912  AST 24  ALT 17  ALKPHOS 71  BILITOT 1.3*  PROT 8.4*  ALBUMIN 4.1   No results for input(s): LIPASE, AMYLASE in the last 168 hours. No results for input(s): AMMONIA in the last 168 hours.  ABG No results found for: PHART, PCO2ART, PO2ART, HCO3, TCO2, ACIDBASEDEF, O2SAT   Coagulation Profile: Recent Labs  Lab 05/31/21 1912  INR 1.0    Cardiac Enzymes: No results for input(s): CKTOTAL, CKMB, CKMBINDEX, TROPONINI in the last 168 hours.  HbA1C: No results found for: HGBA1C  CBG: Recent Labs  Lab 05/31/21 2355  GLUCAP 167*     Past Medical History   Past Medical History:  Diagnosis Date   History of DVT (deep vein thrombosis)       Surgical History   History reviewed. No pertinent surgical history.    Social History   Social History   Socioeconomic History   Marital status: Married    Spouse name: Not on file   Number of children: Not on file   Years of education: Not on file   Highest education level: Not on file  Occupational History   Not on file  Tobacco Use   Smoking status: Never  Smokeless tobacco: Never  Substance and Sexual Activity   Alcohol use: No   Drug use: No   Sexual activity: Not on file  Other Topics Concern   Not on file  Social History Narrative   Not on file   Social Determinants of Health   Financial Resource Strain: Not on file  Food Insecurity: Not on file  Transportation Needs: Not on file  Physical Activity: Not on file  Stress: Not on file  Social Connections: Not on file      Family History   History reviewed. No pertinent family history. family history is not on file.    Allergies No Known Allergies    Current Medications  Current Facility-Administered Medications:    Chlorhexidine Gluconate Cloth 2 % PADS 6 each, 6 each, Topical, Daily, Marcelle Smiling, MD   heparin ADULT infusion 100 units/mL (25000 units/274mL), 1,300 Units/hr, Intravenous, Continuous, Daylene Posey, RPH, Last  Rate: 13 mL/hr at 06/01/21 0000, 1,300 Units/hr at 06/01/21 0000   Home Medications  Prior to Admission medications   Not on File      Critical care time: 45 minutes.  The treatment and management of the patient's condition was required based on the threat of imminent deterioration. This time reflects time spent by the physician evaluating, providing care and managing the critically ill patient's care. The time was spent at the immediate bedside (or on the same floor/unit and dedicated to this patient's care). Time involved in separately billable procedures is NOT included int he critical care time indicated above. Family meeting and update time may be included above if and only if the patient is unable/incompetent to participate in clinical interview and/or decision making, and the discussion was necessary to determining treatment decisions.   Marcelle Smiling, MD Board Certified by the ABIM, Pulmonary Diseases & Critical Care Medicine

## 2021-06-01 NOTE — Progress Notes (Addendum)
Echocardiogram 2D Echocardiogram has been performed.  Warren Lacy Orah Sonnen RDCS 06/01/2021, 8:12 AM  Notified Dr. Izora Ribas of stat at 8:13

## 2021-06-01 NOTE — Progress Notes (Signed)
Ms. Copado was feeling better this morning, but developed sudden hypotension after several hours of being hypertensive, associated with nausea and fatigue. Change plans to push systemic TPA given sudden worsening status.   Steffanie Dunn, DO 06/01/21 8:46 AM [facility]

## 2021-06-01 NOTE — Progress Notes (Signed)
ANTICOAGULATION CONSULT NOTE   Pharmacy Consult for heparin s/p tPA Indication: pulmonary embolus  No Known Allergies  Patient Measurements: Height: 5\' 5"  (165.1 cm) Weight: 99.3 kg (218 lb 14.7 oz) IBW/kg (Calculated) : 57 Heparin Dosing Weight: 78.7kg  Vital Signs: Temp: 98.3 F (36.8 C) (07/21 1952) Temp Source: Oral (07/21 1952) BP: 167/111 (07/21 1903) Pulse Rate: 87 (07/21 1903)  Labs: Recent Labs    05/31/21 1912 06/01/21 0552 06/01/21 1155 06/01/21 1905  HGB 14.8 13.2 14.0  --   HCT 42.0 37.1 41.7  --   PLT 375 293 268  --   APTT 24  --  54*  --   LABPROT 13.0  --  17.9*  --   INR 1.0  --  1.5*  --   HEPARINUNFRC  --  0.33  --  0.22*  CREATININE 0.92 0.72  --   --   TROPONINIHS 36* 744*  --   --      Estimated Creatinine Clearance: 106.9 mL/min (by C-G formula based on SCr of 0.72 mg/dL).   Medical History: Past Medical History:  Diagnosis Date   History of DVT (deep vein thrombosis)     Assessment: 89 yoF admitted with PE. IV heparin started. Alteplase 100mg  given ~0900 7/21 with hypotension.  Heparin level this evening is SUBtherapeutic (HL 0.22, goal of 0.3-0.5 for 24h s/p tPA). No bleeding or issues noted per RN.   Goal of Therapy:  Heparin level 0.3-0.5 units/ml x24h Monitor platelets by anticoagulation protocol: Yes   Plan:  - Increase Heparin to 1550 units/hr (15.5 ml/hr) - Will continue to monitor for any signs/symptoms of bleeding and will follow up with heparin level in 6 hours   Thank you for allowing pharmacy to be a part of this patient's care.  06-03-1994, PharmD, BCPS Clinical Pharmacist Clinical phone for 06/01/2021: [phone] 06/01/2021 8:28 PM   **Pharmacist phone directory can now be found on [email or web address] ([email or web address]).  Listed under [facility].

## 2021-06-01 NOTE — Progress Notes (Signed)
ANTICOAGULATION CONSULT NOTE   Pharmacy Consult for heparin Indication: pulmonary embolus  No Known Allergies  Patient Measurements: Height: 5\' 5"  (165.1 cm) Weight: 99.3 kg (218 lb 14.7 oz) IBW/kg (Calculated) : 57 Heparin Dosing Weight: 78.7kg  Vital Signs: Temp: 98.7 F (37.1 C) (07/21 1122) Temp Source: Oral (07/21 1122) BP: 141/85 (07/21 1215) Pulse Rate: 82 (07/21 1215)  Labs: Recent Labs    05/31/21 1912 06/01/21 0552 06/01/21 1155  HGB 14.8 13.2 14.0  HCT 42.0 37.1 41.7  PLT 375 293 268  APTT 24  --   --   LABPROT 13.0  --   --   INR 1.0  --   --   HEPARINUNFRC  --  0.33  --   CREATININE 0.92 0.72  --   TROPONINIHS 36* 744*  --      Estimated Creatinine Clearance: 106.9 mL/min (by C-G formula based on SCr of 0.72 mg/dL).   Medical History: Past Medical History:  Diagnosis Date   History of DVT (deep vein thrombosis)     Assessment: 59 yoF admitted with PE. IV heparin started. Alteplase 100mg  given ~0900 7/21 with hypotension.  Post-infusion aPTT is 54 seconds, CBC stable. Will reduce heparin level goal x24h s/p lysis.  Goal of Therapy:  Heparin level 0.3-0.5 units/ml x24h Monitor platelets by anticoagulation protocol: Yes   Plan:  Restart heparin 1400 units/h no bolus Check 6h heparin level  06-03-1992, PharmD, [place], [facility] Clinical Pharmacist [phone] Please check AMION for all [facility] Pharmacy numbers 06/01/2021

## 2021-06-02 ENCOUNTER — Other Ambulatory Visit (HOSPITAL_COMMUNITY): Payer: Self-pay

## 2021-06-02 DIAGNOSIS — I2609 Other pulmonary embolism with acute cor pulmonale: Secondary | ICD-10-CM

## 2021-06-02 LAB — BASIC METABOLIC PANEL
Anion gap: 10 (ref 5–15)
BUN: 11 mg/dL (ref 6–20)
CO2: 23 mmol/L (ref 22–32)
Calcium: 8.8 mg/dL — ABNORMAL LOW (ref 8.9–10.3)
Chloride: 108 mmol/L (ref 98–111)
Creatinine, Ser: 0.82 mg/dL (ref 0.44–1.00)
GFR, Estimated: >60 mL/min (ref >60)
Glucose, Bld: 115 mg/dL — ABNORMAL HIGH (ref 70–99)
Potassium: 4.1 mmol/L (ref 3.5–5.1)
Sodium: 141 mmol/L (ref 135–145)

## 2021-06-02 LAB — CBC
HCT: 39.1 % (ref 36.0–46.0)
Hemoglobin: 13.2 g/dL (ref 12.0–15.0)
MCH: 31.9 pg (ref 26.0–34.0)
MCHC: 33.8 g/dL (ref 30.0–36.0)
MCV: 94.4 fL (ref 80.0–100.0)
Platelets: 227 10*3/uL (ref 150–400)
RBC: 4.14 MIL/uL (ref 3.87–5.11)
RDW: 12.3 % (ref 11.5–15.5)
WBC: 10.5 10*3/uL (ref 4.0–10.5)
nRBC: 0 % (ref 0.0–0.2)

## 2021-06-02 LAB — HEPARIN LEVEL (UNFRACTIONATED): Heparin Unfractionated: 0.4 IU/mL (ref 0.30–0.70)

## 2021-06-02 LAB — MAGNESIUM: Magnesium: 2.3 mg/dL (ref 1.7–2.4)

## 2021-06-02 MED ORDER — APIXABAN 5 MG PO TABS
5.0000 mg | ORAL_TABLET | Freq: Two times a day (BID) | ORAL | Status: DC
Start: 2021-06-09 — End: 2021-06-04

## 2021-06-02 MED ORDER — FAMOTIDINE 20 MG PO TABS
20.0000 mg | ORAL_TABLET | Freq: Two times a day (BID) | ORAL | Status: DC
  Administered 2021-06-02 – 2021-06-04 (×5): 20 mg via ORAL
  Filled 2021-06-02 (×5): qty 1

## 2021-06-02 MED ORDER — LOSARTAN POTASSIUM 25 MG PO TABS
25.0000 mg | ORAL_TABLET | Freq: Every day | ORAL | Status: DC
  Administered 2021-06-02: 25 mg via ORAL
  Filled 2021-06-02: qty 1

## 2021-06-02 MED ORDER — ACETAMINOPHEN 325 MG PO TABS
650.0000 mg | ORAL_TABLET | ORAL | Status: DC | PRN
  Administered 2021-06-02 – 2021-06-04 (×4): 650 mg via ORAL
  Filled 2021-06-02 (×4): qty 2

## 2021-06-02 MED ORDER — APIXABAN 5 MG PO TABS
10.0000 mg | ORAL_TABLET | Freq: Two times a day (BID) | ORAL | Status: DC
  Administered 2021-06-02 – 2021-06-04 (×5): 10 mg via ORAL
  Filled 2021-06-02 (×5): qty 2

## 2021-06-02 MED ORDER — APIXABAN (ELIQUIS) EDUCATION KIT FOR DVT/PE PATIENTS
PACK | Freq: Once | Status: AC
  Filled 2021-06-02: qty 1

## 2021-06-02 NOTE — Discharge Instructions (Addendum)
Information on my medicine - ELIQUIS (apixaban)  This medication education was reviewed with me or my healthcare representative as part of my discharge preparation.  Why was Eliquis prescribed for you? Eliquis was prescribed to treat blood clots that may have been found in the veins of your legs (deep vein thrombosis) or in your lungs (pulmonary embolism) and to reduce the risk of them occurring again.  What do You need to know about Eliquis ? The starting dose is 10 mg (two 5 mg tablets) taken TWICE daily for the FIRST SEVEN (7) DAYS, then on 06/09/21  the dose is reduced to ONE 5 mg tablet taken TWICE daily.  Eliquis may be taken with or without food.   Try to take the dose about the same time in the morning and in the evening. If you have difficulty swallowing the tablet whole please discuss with your pharmacist how to take the medication safely.  Take Eliquis exactly as prescribed and DO NOT stop taking Eliquis without talking to the doctor who prescribed the medication.  Stopping may increase your risk of developing a new blood clot.  Refill your prescription before you run out.  After discharge, you should have regular check-up appointments with your healthcare provider that is prescribing your Eliquis.    What do you do if you miss a dose? If a dose of ELIQUIS is not taken at the scheduled time, take it as soon as possible on the same day and twice-daily administration should be resumed. The dose should not be doubled to make up for a missed dose.  Important Safety Information A possible side effect of Eliquis is bleeding. You should call your healthcare provider right away if you experience any of the following: Bleeding from an injury or your nose that does not stop. Unusual colored urine (red or dark brown) or unusual colored stools (red or black). Unusual bruising for unknown reasons. A serious fall or if you hit your head (even if there is no bleeding).  Some medicines  may interact with Eliquis and might increase your risk of bleeding or clotting while on Eliquis. To help avoid this, consult your healthcare provider or pharmacist prior to using any new prescription or non-prescription medications, including herbals, vitamins, non-steroidal anti-inflammatory drugs (NSAIDs) and supplements.  This website has more information on Eliquis (apixaban): http://www.eliquis.com/eliquis/home

## 2021-06-02 NOTE — Plan of Care (Signed)
  Problem: Education: Goal: Knowledge of General Education information will improve Description: Including pain rating scale, medication(s)/side effects and non-pharmacologic comfort measures Outcome: Progressing   Problem: Health Behavior/Discharge Planning: Goal: Ability to manage health-related needs will improve Outcome: Progressing   Problem: Clinical Measurements: Goal: Ability to maintain clinical measurements within normal limits will improve Outcome: Progressing Goal: Diagnostic test results will improve Outcome: Progressing Goal: Respiratory complications will improve Outcome: Progressing   Problem: Activity: Goal: Risk for activity intolerance will decrease Outcome: Progressing   Problem: Pain Managment: Goal: General experience of comfort will improve Outcome: Progressing   

## 2021-06-02 NOTE — Progress Notes (Signed)
NAMEYasmyn Mclaughlin MRN:  [id number] DOB:  10-17-1981 LOS: 2 ADMISSION DATE:  05/31/2021 DATE OF SERVICE:  06/01/2021  CHIEF COMPLAINT:  dyspnea, palpitations   HISTORY & PHYSICAL  History of Present Illness  This 40 y.o. Caucasian female non-smoker presented to [facility] with complaints of dyspnea and palpitations. At [facility], she also reported experiencing near-syncopal symptoms prior to presentation.  SpO2 on room air was 80%.  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  She reports "feeling them moving up her leg" over the past week but neglected to present for medical attention until today.  At [facility], she was diagnosed with bilateral pulmonary emboli.  She was hypotensive there but responded favorably to IV fluid bolus and, in fact, is hypertensive upon transfer to [facility]  No recent trauma/surgery or long distance travel.  She has no previous history of deep vein thrombosis and denies family history of venous thromboembolism or hypercoagulability.  She was incidentally found to have a thyroid mass on CTA today, which she states she has been aware of in the past (no diagnostic evaluation).    Past Medical/Surgical/Social/Family History   Past Medical History:  Diagnosis Date   History of DVT (deep vein thrombosis)     History reviewed. No pertinent surgical history.  Social History   Tobacco Use   Smoking status: Never   Smokeless tobacco: Never  Substance Use Topics   Alcohol use: No    History reviewed. No pertinent family history.      Significant Diagnostic Tests:  7/20: chest CTA showed bilateral pulmonary emboli and thyroid mass. 7/21: developed hypotension, nausea, fatigue; TPA given around 11 am; switched to heparin 7/22: stable overnight; remains on heparin; started on losartan   Interim history/subjective:  No complaints from patient other than mild chest tightness. On room air BP  hypertensive with systolics 160-170s   Objective   BP (!) 177/100   Pulse 69   Temp 98 F (36.7 C) (Oral)   Resp 19   Ht 5\' 5"  (1.651 m)   Wt 102.6 kg   LMP 05/31/2021   SpO2 99%   BMI 37.64 kg/m     Filed Weights   05/31/21 1903 05/31/21 2300 06/02/21 0500  Weight: 96.2 kg 99.3 kg 102.6 kg    Intake/Output Summary (Last 24 hours) at 06/02/2021 1030 Last data filed at 06/02/2021 0600 Gross per 24 hour  Intake 1306.82 ml  Output 1050 ml  Net 256.82 ml        Examination: General:  NAD HEENT: MM pink/moist Neuro: Aox4; MAE CV: s1s2, RRR, no m/r/g; hypertension with systolics 160-170s PULM:  dim clear BS bilaterally; on room air with sats 99% GI: soft, bsx4 active  Extremities: warm/dry, SCDs in place   Resolved Hospital Problem list   Hypokalemia   Assessment & Plan:    Acute hypoxemic respiratory failure Acute pulmonary embolism: Tpa given 7/21 due to hypotension, nausea, fatigue;  P: -continue heparin; consider switching to DOAC tomorrow -currently on room air; maintain O2 sats  >92%   Hypertension: initially hypotensive P: -continue telemetry monitoring -will start on losartan  Leukocytosis: (Improving) likely demargination History of superficial venous thrombosis P: -DVT PPX: heparin -trend CBC/fever  Hyperglycemia: without history of diabetes mellitus P: -Glucose 115 on most recent labs -continue to trend BMP  Thyroid Mass P: -will need further work up to rule out malignancy when more stable   Will ask TRH  to continue care on 7/23; PCCM will continue to consult for PE.  Best practice:  Diet: regular Pain/Anxiety/Delirium protocol (if indicated): N/A VAP protocol (if indicated): N/A DVT prophylaxis: heparin GI prophylaxis: famotidine Glucose control: N/A Mobility/Activity: bedrest for now   Code Status: Full Code Family Communication:   no family at bedside ; 7/22 patient updated at bedside on plan of care Disposition: admit to  ICU   Labs   CBC: Recent Labs  Lab 05/31/21 1912 06/01/21 0552 06/01/21 1155 06/02/21 0647  WBC 15.1* 11.0* 17.7* 10.5  HGB 14.8 13.2 14.0 13.2  HCT 42.0 37.1 41.7 39.1  MCV 89.7 90.3 93.7 94.4  PLT 375 293 268 227     Basic Metabolic Panel: Recent Labs  Lab 05/31/21 1912 06/01/21 0552 06/02/21 0647  NA 133* 137 141  K 3.1* 3.7 4.1  CL 98 107 108  CO2 22 22 23   GLUCOSE 210* 146* 115*  BUN 20 12 11   CREATININE 0.92 0.72 0.82  CALCIUM 9.0 8.4* 8.8*  MG  --  2.3 2.3    GFR: Estimated Creatinine Clearance: 106.1 mL/min (by C-G formula based on SCr of 0.82 mg/dL). Recent Labs  Lab 05/31/21 1912 06/01/21 0552 06/01/21 0926 06/01/21 1155 06/02/21 0647  WBC 15.1* 11.0*  --  17.7* 10.5  LATICACIDVEN  --   --  1.6 1.5  --      Liver Function Tests: Recent Labs  Lab 05/31/21 1912  AST 24  ALT 17  ALKPHOS 71  BILITOT 1.3*  PROT 8.4*  ALBUMIN 4.1    No results for input(s): LIPASE, AMYLASE in the last 168 hours. No results for input(s): AMMONIA in the last 168 hours.  ABG No results found for: PHART, PCO2ART, PO2ART, HCO3, TCO2, ACIDBASEDEF, O2SAT   Coagulation Profile: Recent Labs  Lab 05/31/21 1912 06/01/21 1155  INR 1.0 1.5*     Cardiac Enzymes: No results for input(s): CKTOTAL, CKMB, CKMBINDEX, TROPONINI in the last 168 hours.  HbA1C: Hgb A1c MFr Bld  Date/Time Value Ref Range Status  06/01/2021 05:52 AM 5.8 (H) 4.8 - 5.6 % Final    Comment:    (NOTE) Pre diabetes:          5.7%-6.4%  Diabetes:              >6.4%  Glycemic control for   <7.0% adults with diabetes     CBG: Recent Labs  Lab 05/31/21 2355  GLUCAP 167*      Past Medical History   Past Medical History:  Diagnosis Date   History of DVT (deep vein thrombosis)       Surgical History   History reviewed. No pertinent surgical history.    Social History   Social History   Socioeconomic History   Marital status: Married    Spouse name: Not on file    Number of children: Not on file   Years of education: Not on file   Highest education level: Not on file  Occupational History   Not on file  Tobacco Use   Smoking status: Never   Smokeless tobacco: Never  Substance and Sexual Activity   Alcohol use: No   Drug use: No   Sexual activity: Not on file  Other Topics Concern   Not on file  Social History Narrative   Not on file   Social Determinants of Health   Financial Resource Strain: Not on file  Food Insecurity: Not on file  Transportation Needs: Not on file  Physical Activity: Not on file  Stress: Not on file  Social Connections: Not on file      Family History   History reviewed. No pertinent family history. family history is not on file.    Allergies No Known Allergies    Current Medications  Current Facility-Administered Medications:    Chlorhexidine Gluconate Cloth 2 % PADS 6 each, 6 each, Topical, Daily, Marcelle Smiling, MD, 6 each at 06/01/21 1000   docusate sodium (COLACE) capsule 100 mg, 100 mg, Oral, BID PRN, Marcelle Smiling, MD   famotidine (PEPCID) tablet 20 mg, 20 mg, Oral, BID, Lorin Glass, MD, 20 mg at 06/02/21 8127   heparin ADULT infusion 100 units/mL (25000 units/229mL), 1,550 Units/hr, Intravenous, Continuous, Ann Held, [facility], Last Rate: 15.5 mL/hr at 06/02/21 0600, 1,550 Units/hr at 06/02/21 0600   hydrALAZINE (APRESOLINE) injection 10 mg, 10 mg, Intravenous, Q6H PRN, Karie Fetch P, DO   ondansetron [facility]) injection 4 mg, 4 mg, Intravenous, Q6H PRN, Marcelle Smiling, MD, 4 mg at 06/01/21 0825   polyethylene glycol (MIRALAX / GLYCOLAX) packet 17 g, 17 g, Oral, Daily PRN, Marcelle Smiling, MD   Home Medications  Prior to Admission medications   Not on File     Critical Care Time: 35 minutes  JD Anselm Lis [facility] 06/02/2021, 10:55 AM  Please see [email or web address] for pager details.  From 7A-7P if no response, please call [phone]. After hours,  please call ELink [phone].

## 2021-06-02 NOTE — TOC Benefit Eligibility Note (Signed)
Patient Advocate Encounter  Insurance verification completed.    The patient is uninsured,  Caprisha Bridgett, CPhT Pharmacy Patient Advocate Specialist [place] [facility] Stewardship Team Direct Number: [phone]  Fax: [phone]        

## 2021-06-02 NOTE — Progress Notes (Signed)
ANTICOAGULATION CONSULT NOTE   Pharmacy Consult for heparin s/p tPA Indication: pulmonary embolus  No Known Allergies  Patient Measurements: Height: 5\' 5"  (165.1 cm) Weight: 102.6 kg (226 lb 3.1 oz) IBW/kg (Calculated) : 57 Heparin Dosing Weight: 78.7kg  Vital Signs: Temp: 98 F (36.7 C) (07/22 0745) Temp Source: Oral (07/22 0745) BP: 177/100 (07/22 0600) Pulse Rate: 69 (07/22 0800)  Labs: Recent Labs    05/31/21 1912 06/01/21 0552 06/01/21 1155 06/01/21 1905 06/02/21 0647  HGB 14.8 13.2 14.0  --  13.2  HCT 42.0 37.1 41.7  --  39.1  PLT 375 293 268  --  227  APTT 24  --  54*  --   --   LABPROT 13.0  --  17.9*  --   --   INR 1.0  --  1.5*  --   --   HEPARINUNFRC  --  0.33  --  0.22* 0.40  CREATININE 0.92 0.72  --   --  0.82  TROPONINIHS 36* 744*  --   --   --      Estimated Creatinine Clearance: 106.1 mL/min (by C-G formula based on SCr of 0.82 mg/dL).   Medical History: Past Medical History:  Diagnosis Date   History of DVT (deep vein thrombosis)     Assessment: 55 yoF admitted with PE. IV heparin started. Alteplase 100mg  given ~0900 7/21 with hypotension.  Heparin level this morning is therapeutic (HL 0.4, goal of 0.3-0.5 for 24h s/p tPA). No bleeding or issues noted per RN.   Goal of Therapy:  Heparin level 0.3-0.7 units/ml Monitor platelets by anticoagulation protocol: Yes   Plan:  - Continue Heparin at 1550 units/hr (15.5 ml/hr) - Will continue to monitor for any signs/symptoms of bleeding and will follow up with heparin level in 6 hours to confirm -Will extend heparin levels to full range after noon today  Thank you for allowing pharmacy to be a part of this patient's care.  06-03-1992 PharmD., BCPS Clinical Pharmacist 06/02/2021 8:31 AM  **Pharmacist phone directory can now be found on [email or web address] ([email or web address]).  Listed under [facility].

## 2021-06-03 DIAGNOSIS — J9601 Acute respiratory failure with hypoxia: Secondary | ICD-10-CM

## 2021-06-03 LAB — BASIC METABOLIC PANEL
Anion gap: 9 (ref 5–15)
BUN: 9 mg/dL (ref 6–20)
CO2: 23 mmol/L (ref 22–32)
Calcium: 9.2 mg/dL (ref 8.9–10.3)
Chloride: 108 mmol/L (ref 98–111)
Creatinine, Ser: 0.71 mg/dL (ref 0.44–1.00)
GFR, Estimated: >60 mL/min (ref >60)
Glucose, Bld: 117 mg/dL — ABNORMAL HIGH (ref 70–99)
Potassium: 3.6 mmol/L (ref 3.5–5.1)
Sodium: 140 mmol/L (ref 135–145)

## 2021-06-03 LAB — CBC
HCT: 39.3 % (ref 36.0–46.0)
Hemoglobin: 13.4 g/dL (ref 12.0–15.0)
MCH: 31.7 pg (ref 26.0–34.0)
MCHC: 34.1 g/dL (ref 30.0–36.0)
MCV: 92.9 fL (ref 80.0–100.0)
Platelets: 284 10*3/uL (ref 150–400)
RBC: 4.23 MIL/uL (ref 3.87–5.11)
RDW: 12.2 % (ref 11.5–15.5)
WBC: 10.2 10*3/uL (ref 4.0–10.5)
nRBC: 0 % (ref 0.0–0.2)

## 2021-06-03 MED ORDER — POTASSIUM CHLORIDE CRYS ER 20 MEQ PO TBCR
40.0000 meq | EXTENDED_RELEASE_TABLET | Freq: Once | ORAL | Status: AC
  Administered 2021-06-03: 40 meq via ORAL
  Filled 2021-06-03: qty 2

## 2021-06-03 MED ORDER — AMLODIPINE BESYLATE 10 MG PO TABS
10.0000 mg | ORAL_TABLET | Freq: Every day | ORAL | Status: DC
  Administered 2021-06-03 – 2021-06-04 (×2): 10 mg via ORAL
  Filled 2021-06-03 (×2): qty 1

## 2021-06-03 MED ORDER — CARVEDILOL 3.125 MG PO TABS
3.1250 mg | ORAL_TABLET | Freq: Two times a day (BID) | ORAL | Status: DC
  Administered 2021-06-03 – 2021-06-04 (×2): 3.125 mg via ORAL
  Filled 2021-06-03 (×2): qty 1

## 2021-06-03 MED ORDER — LOSARTAN POTASSIUM 50 MG PO TABS
100.0000 mg | ORAL_TABLET | Freq: Every day | ORAL | Status: DC
  Administered 2021-06-03 – 2021-06-04 (×2): 100 mg via ORAL
  Filled 2021-06-03 (×2): qty 2

## 2021-06-03 NOTE — Progress Notes (Signed)
Bps still an issue Increase losartan Start amlodipine

## 2021-06-03 NOTE — Progress Notes (Signed)
PROGRESS NOTE    Sabrina Mclaughlin  TKZ:[id number] DOB: [birth year] DOA: 05/31/2021 PCP: Patient, No Pcp Per (Inactive)    Chief Complaint  Patient presents with   Loss of Consciousness    Brief Narrative:  This 40 y.o. Caucasian female non-smoker presented to [facility] with complaints of dyspnea and palpitations. At [facility], she also reported experiencing near-syncopal symptoms prior to presentation.  SpO2 on room air was 80%.  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, found to have acute PE  Subjective:  Bp elevated, no chest pain, no sob She walked today, no dizziness Daughter at bedside     Assessment & Plan:   Principal Problem:   Acute hypoxemic respiratory failure (HCC) Active Problems:   Pulmonary embolism (HCC)   Hypotension   Hypokalemia   Thyroid mass  Massive PE/right heart strain/acute hypoxic respiratory failure -She was started on heparin drip and admitted to ICU, initially planned catheter directed thrombolysis aborted due to clinical deterioration, she received systemic tPA -She has greatly improved after systemic tPA, now transitioned on Eliquis and transferred out of ICU to hospitalist service -She has no pcp, she has no insurance, case manager consulted for medication assistant  Leukocytosis Likely reactive Normalized  Hypertension -Initially presented with hypotension likely due to massive PE, now blood pressure is elevated -Not on medication previously -She is started on Norvasc and Cozaar on 7/22, blood pressure still significantly elevated today, will start Coreg -check renin/aldo ratio  Hypokalemia Potassium 3.1 on presentation Replaced and normalized Monitor  Prediabetes? A1c 5.8% Need to follow-up with PCP    Thyroid mass Reported history of thyroid  biopsy Check TSH Follow-up with PCP   Body mass index is 37.64 kg/m.Marland Kitchen      Unresulted Labs (From admission, onward)      Start     Ordered   06/02/21 0500  Basic metabolic panel  Daily,   R     Question:  Specimen collection method  Answer:  Lab=Lab collect   06/01/21 1245              DVT prophylaxis: SCDs Start: 06/01/21 0042 apixaban (ELIQUIS) tablet 10 mg  apixaban (ELIQUIS) tablet 5 mg   Code Status: Full Family Communication: Daughter at bedside Disposition:   Status is: Inpatient   Dispo: The patient is from: Home              Anticipated d/c is to: Home              Anticipated d/c date is: Likely tomorrow                Consultants:  Critical care IR  Procedures:  None  Antimicrobials:   None     Objective: Vitals:   06/03/21 1000 06/03/21 1100 06/03/21 1200 06/03/21 1225  BP: (!) 203/105 (!) 191/92 (!) 163/98   Pulse: (!) 105 97 100   Resp: (!) 21 18 20    Temp:    98.9 F (37.2 C)  TempSrc:      SpO2: 97% 98% 98%   Weight:      Height:        Intake/Output Summary (Last 24 hours) at 06/03/2021 1639 Last data filed at 06/03/2021 0400 Gross per 24 hour  Intake 480 ml  Output --  Net 480 ml   Filed Weights   05/31/21 1903 05/31/21 2300 06/02/21 0500  Weight: 96.2 kg 99.3 kg 102.6 kg  Examination:  General exam: calm, NAD Respiratory system: Clear to auscultation. Respiratory effort normal. Cardiovascular system: S1 & S2 heard, RRR. No JVD, no murmur, No pedal edema. Gastrointestinal system: Abdomen is nondistended, soft and nontender.  Normal bowel sounds heard. Central nervous system: Alert and oriented. No focal neurological deficits. Extremities: Symmetric 5 x 5 power. Skin: No rashes, lesions or ulcers Psychiatry: Judgement and insight appear normal. Mood & affect appropriate.     Data Reviewed: I have personally reviewed following labs and imaging studies  CBC: Recent Labs  Lab 05/31/21 1912 06/01/21 0552 06/01/21 1155 06/02/21 0647 06/03/21 0229  WBC 15.1* 11.0* 17.7* 10.5 10.2  HGB 14.8 13.2 14.0 13.2 13.4  HCT 42.0 37.1 41.7  39.1 39.3  MCV 89.7 90.3 93.7 94.4 92.9  PLT 375 293 268 227 284    Basic Metabolic Panel: Recent Labs  Lab 05/31/21 1912 06/01/21 0552 06/02/21 0647 06/03/21 0229  NA 133* 137 141 140  K 3.1* 3.7 4.1 3.6  CL 98 107 108 108  CO2 22 22 23 23   GLUCOSE 210* 146* 115* 117*  BUN 20 12 11 9   CREATININE 0.92 0.72 0.82 0.71  CALCIUM 9.0 8.4* 8.8* 9.2  MG  --  2.3 2.3  --     GFR: Estimated Creatinine Clearance: 108.8 mL/min (by C-G formula based on SCr of 0.71 mg/dL).  Liver Function Tests: Recent Labs  Lab 05/31/21 1912  AST 24  ALT 17  ALKPHOS 71  BILITOT 1.3*  PROT 8.4*  ALBUMIN 4.1    CBG: Recent Labs  Lab 05/31/21 2355  GLUCAP 167*     Recent Results (from the past 240 hour(s))  Resp Panel by RT-PCR (Flu A&B, Covid) Nasopharyngeal Swab     Status: None   Collection Time: 05/31/21  7:47 PM   Specimen: Nasopharyngeal Swab; Nasopharyngeal(NP) swabs in vial transport medium  Result Value Ref Range Status   SARS Coronavirus 2 by RT PCR NEGATIVE NEGATIVE Final    Comment: (NOTE) SARS-CoV-2 target nucleic acids are NOT DETECTED.  The SARS-CoV-2 RNA is generally detectable in upper respiratory specimens during the acute phase of infection. The lowest concentration of SARS-CoV-2 viral copies this assay can detect is 138 copies/mL. A negative result does not preclude SARS-Cov-2 infection and should not be used as the sole basis for treatment or other patient management decisions. A negative result may occur with  improper specimen collection/handling, submission of specimen other than nasopharyngeal swab, presence of viral mutation(s) within the areas targeted by this assay, and inadequate number of viral copies(<138 copies/mL). A negative result must be combined with clinical observations, patient history, and epidemiological information. The expected result is Negative.  Fact Sheet for Patients:  06/02/21  Fact Sheet for  Healthcare Providers:  06/02/21  This test is no t yet approved or cleared by the [employer] FDA and  has been authorized for detection and/or diagnosis of SARS-CoV-2 by FDA under an Emergency Use Authorization (EUA). This EUA will remain  in effect (meaning this test can be used) for the duration of the COVID-19 declaration under Section 564(b)(1) of the Act, 21 U.S.C.section 360bbb-3(b)(1), unless the authorization is terminated  or revoked sooner.       Influenza A by PCR NEGATIVE NEGATIVE Final   Influenza B by PCR NEGATIVE NEGATIVE Final    Comment: (NOTE) The Xpert Xpress SARS-CoV-2/FLU/RSV plus assay is intended as an aid in the diagnosis of influenza from Nasopharyngeal swab specimens and should not be used as  a sole basis for treatment. Nasal washings and aspirates are unacceptable for Xpert Xpress SARS-CoV-2/FLU/RSV testing.  Fact Sheet for Patients: [employer]gerCourse.com  Fact Sheet for Healthcare Providers: SeriousBroker.it  This test is not yet approved or cleared by the [place] FDA and has been authorized for detection and/or diagnosis of SARS-CoV-2 by FDA under an Emergency Use Authorization (EUA). This EUA will remain in effect (meaning this test can be used) for the duration of the COVID-19 declaration under Section 564(b)(1) of the Act, 21 U.S.C. section 360bbb-3(b)(1), unless the authorization is terminated or revoked.  Performed at [facility], [address] [place], [place] [address]   MRSA Next Gen by PCR, Nasal     Status: None   Collection Time: 05/31/21 11:05 PM   Specimen: Nasal Mucosa; Nasal Swab  Result Value Ref Range Status   MRSA by PCR Next Gen NOT DETECTED NOT DETECTED Final    Comment: (NOTE) The GeneXpert MRSA Assay (FDA approved for NASAL specimens only), is one component of a comprehensive MRSA colonization surveillance program. It is  not intended to diagnose MRSA infection nor to guide or monitor treatment for MRSA infections. Test performance is not FDA approved in patients less than 48 years old. Performed at [facility], [address], [place], [place] [address]          Radiology Studies: No results found.      Scheduled Meds:  amLODipine  10 mg Oral Daily   apixaban  10 mg Oral BID   Followed by   Melene Muller ON 06/09/2021] apixaban  5 mg Oral BID   Chlorhexidine Gluconate Cloth  6 each Topical Daily   famotidine  20 mg Oral BID   losartan  100 mg Oral Daily   Continuous Infusions:   LOS: 3 days   Time spent: 35 mins Greater than 50% of this time was spent in counseling, explanation of diagnosis, planning of further management, and coordination of care.   Voice Recognition Reubin Milan dictation system was used to create this note, attempts have been made to correct errors. Please contact the author with questions and/or clarifications.   Albertine Grates, MD PhD FACP [facility]  Available via Epic secure chat 7am-7pm for nonurgent issues Please page for urgent issues To page the attending provider between 7A-7P or the covering provider during after hours 7P-7A, please log into the web site [email or web address] and access using universal [place] password for that web site. If you do not have the password, please call the hospital operator.    06/03/2021, 4:39 PM

## 2021-06-04 LAB — COMPREHENSIVE METABOLIC PANEL
ALT: 33 U/L (ref 0–44)
AST: 28 U/L (ref 15–41)
Albumin: 3.5 g/dL (ref 3.5–5.0)
Alkaline Phosphatase: 73 U/L (ref 38–126)
Anion gap: 10 (ref 5–15)
BUN: 7 mg/dL (ref 6–20)
CO2: 23 mmol/L (ref 22–32)
Calcium: 9.4 mg/dL (ref 8.9–10.3)
Chloride: 104 mmol/L (ref 98–111)
Creatinine, Ser: 0.82 mg/dL (ref 0.44–1.00)
GFR, Estimated: >60 mL/min (ref >60)
Glucose, Bld: 109 mg/dL — ABNORMAL HIGH (ref 70–99)
Potassium: 3.9 mmol/L (ref 3.5–5.1)
Sodium: 137 mmol/L (ref 135–145)
Total Bilirubin: 1 mg/dL (ref 0.3–1.2)
Total Protein: 7.5 g/dL (ref 6.5–8.1)

## 2021-06-04 LAB — TSH: TSH: 2.574 u[IU]/mL (ref 0.350–4.500)

## 2021-06-04 MED ORDER — AMLODIPINE BESYLATE 10 MG PO TABS: 10.0000 mg | ORAL_TABLET | Freq: Every day | ORAL | 0 refills | Status: DC

## 2021-06-04 MED ORDER — CARVEDILOL 3.125 MG PO TABS: 3.1250 mg | ORAL_TABLET | Freq: Two times a day (BID) | ORAL | 0 refills | Status: DC

## 2021-06-04 MED ORDER — LOSARTAN POTASSIUM 100 MG PO TABS: 100.0000 mg | ORAL_TABLET | Freq: Every day | ORAL | 0 refills | Status: DC

## 2021-06-04 MED ORDER — APIXABAN (ELIQUIS) VTE STARTER PACK (10MG AND 5MG): ORAL_TABLET | ORAL | 0 refills | Status: DC

## 2021-06-04 MED ORDER — SPIRONOLACTONE 12.5 MG HALF TABLET
12.5000 mg | ORAL_TABLET | Freq: Every day | ORAL | Status: DC
  Administered 2021-06-04: 12.5 mg via ORAL
  Filled 2021-06-04: qty 1

## 2021-06-04 MED ORDER — ACETAMINOPHEN 325 MG PO TABS: 325.0000 mg | ORAL_TABLET | ORAL | Status: DC | PRN

## 2021-06-04 MED ORDER — SPIRONOLACTONE 25 MG PO TABS: 12.5000 mg | ORAL_TABLET | Freq: Every day | ORAL | 0 refills | Status: DC

## 2021-06-04 NOTE — Progress Notes (Signed)
Home O2 evaluation completed per order. Patient's O2 saturation stayed greater than 90 with ambulation at all times. Patient reported no dizziness or dyspnea.

## 2021-06-04 NOTE — TOC Transition Note (Signed)
Transition of Care [facility]) - CM/SW Discharge Note   Patient Details  Name: Sabrina Mclaughlin MRN: [id number] Date of Birth: 1981-01-27  Transition of [facility]) CM/SW Contact:  Carles Collet, RN Phone Number: 06/04/2021, 9:30 AM   Clinical Narrative:   Met with patient at bedside. Provided with 30 day Eliquis card, and Match letter. Provided with list of [employer], discussed.  Message sent to [place] to schedule follow up by 8/23 for Eliquis refill, with instructions to call patient Monday. Patient provided with [place] office number to call Tuesday if she is not notified of appointment.     Final next level of care: Home/Self Care Barriers to Discharge: No Barriers Identified   Patient Goals and CMS Choice        Discharge Placement                       Discharge Plan and Services                                     Social Determinants of Health (SDOH) Interventions     Readmission Risk Interventions No flowsheet data found.

## 2021-06-04 NOTE — Discharge Summary (Signed)
Discharge Summary  [facility] JWJ:[id number] DOB: [birth year]  PCP: Patient, No Pcp Per (Inactive)  Admit date: 05/31/2021 Discharge date: 06/04/2021  Time spent: 45mins, more than 50% time spent on coordination of [facility].   Recommendations for Outpatient Follow-up:  F/u with PCP within a week  for hospital discharge follow up, pcp to monitor blood pressure, potassium level, blood glucose, thyroid mass Patient is advise to check blood pressure at home and bring in record for pcp to review Appreciate case manager assistance with resources for meds and pcp needs  Pending labs need to follow up by pcp:  Unresulted Labs (From admission, onward)     Start     Ordered   06/04/21 0800  Aldosterone + renin activity w/ ratio  Once,   TIMED        06/04/21 0800            Discharge Diagnoses:  Active Hospital Problems   Diagnosis Date Noted   Acute hypoxemic respiratory failure (HCC) 05/31/2021   Pulmonary embolism (HCC) 05/31/2021   Hypotension 05/31/2021   Hypokalemia 05/31/2021   Thyroid mass 05/31/2021    Resolved Hospital Problems  No resolved problems to display.    Discharge Condition: stable  Diet recommendation: heart healthy  Filed Weights   05/31/21 2300 06/02/21 0500 06/04/21 0432  Weight: 99.3 kg 102.6 kg 96 kg    History of present illness: ( per admitting MD Dr Ardeth PerfectJeong) This 40 y.o. Caucasian female non-smoker presented to [facility] with complaints of dyspnea and palpitations. At [facility], she also reported experiencing near-syncopal symptoms prior to presentation.  SpO2 on room air was 80%.  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  She reports "feeling them moving up her leg" over the past week but neglected to present for medical attention until today.  [facility], she was diagnosed with bilateral pulmonary emboli.  She was hypotensive there but responded favorably to IV fluid bolus and, in fact, is  hypertensive upon transfer [facility].   No recent trauma/surgery or long distance travel.  She has no previous history of deep vein thrombosis and denies family history of venous thromboembolism or hypercoagulability.  She was incidentally found to have a thyroid mass on CTA today, which she states she has been aware of in the past (no diagnostic evaluation).  Hospital Course:  Principal Problem:   Acute hypoxemic respiratory failure (HCC) Active Problems:   Pulmonary embolism (HCC)   Hypotension   Hypokalemia   Thyroid mass  Massive PE/right heart strain/acute hypoxic respiratory failure -She was started on heparin drip and admitted to ICU, initially planned catheter directed thrombolysis aborted due to clinical deterioration, she received systemic tPA -She has greatly improved after systemic tPA, now transitioned on Eliquis  -She has no pcp, she has no insurance, case manager consulted for assistant   Leukocytosis Likely reactive Normalized   Hypertension -Initially presented with hypotension likely due to massive PE, now blood pressure is elevated -Not on medication previously -She is started on Norvasc and Cozaar on 7/22, blood pressure still significantly elevated today, started on Coreg/spironolactone -check renin/aldo ratio, result pending at discharge, pcp to follow up -patient is advised to check blood pressure at home, bring in record for pcp to review   Hypokalemia Potassium 3.1 on presentation Replaced  F/u with pcp   Prediabetes? A1c 5.8% Need to follow-up with PCP      Thyroid mass Reported history of  thyroid  biopsy q50yrs x3 TSH wnl Follow-up with PCP   obesity  Body mass index is 37.64 kg/m.[facility] Reports plan to loose weight    Procedures: none  Consultations: Critical [facility]  Discharge Exam: BP (!) 156/101 (BP Location: Left Arm)   Pulse 87   Temp 98.3 F (36.8 C) (Oral)   Resp (!) 21   Ht  (1.651 m)   Wt 96 kg   LMP 05/31/2021    SpO2 96%   BMI 35.22 kg/m   General: NAD Cardiovascular: RRR Respiratory: CTABL  Discharge Instructions You were cared for by a hospitalist during your hospital stay. If you have any questions about your discharge medications or the [facility] you received while you were in the hospital after you are discharged, you can call the unit and asked to speak with the hospitalist on call if the hospitalist that took [facility]cl[facility] of you is not available. Once you are discharged, your primary [facility] physician will handle any further medical issues. Please note that NO REFILLS for any discharge medications will be authorized once you are discharged, as it is imperative that you return to your primary [facility] physician (or establish a relationship with a primary [facility] physician if you do not have one) for your aftercare needs so that they can reassess your need for medications and monitor your lab values.  Discharge Instructions     Diet - low sodium heart healthy   Complete by: As directed    Increase activity slowly   Complete by: As directed       Allergies as of 06/04/2021   No Known Allergies      Medication List     STOP taking these medications    ibuprofen 200 MG tablet Commonly known as: ADVIL       TAKE these medications    acetaminophen 325 MG tablet Commonly known as: TYLENOL Take 1 tablet (325 mg total) by mouth every 4 (four) hours as needed for headache or mild pain.   amLODipine 10 MG tablet Commonly known as: NORVASC Take 1 tablet (10 mg total) by mouth daily. Start taking on: June 05, 2021   Apixaban Starter Pack (  and ) Commonly known as: ELIQUIS STARTER PACK Take as directed on package: start with two-5mg  tablets twice daily for 7 days. On day 8, switch to one-5mg  tablet twice daily.   carvedilol 3.125 MG tablet Commonly known as: COREG Take 1 tablet (3.125 mg total) by mouth 2 (two) times daily with a meal.   losartan 100 MG tablet Commonly known as: COZAAR Take 1  tablet (100 mg total) by mouth daily. Start taking on: June 05, 2021   spironolactone 25 MG tablet Commonly known as: ALDACTONE Take 0.5 tablets (12.5 mg total) by mouth daily.   Thrive For Life Womens Tabs Take 2 tablets by mouth daily. By Level       No Known Allergies  Follow-up Information     f/u with pcp Follow up in 1 week(s).   Why: hospital discharge follow up, follow up on pending lab result. please have your pcp monitor blood pressure , potassium level , blood glucose and thyroid mass                 The results of significant diagnostics from this hospitalization (including imaging, microbiology, ancillary and laboratory) are listed below for reference.    Significant Diagnostic Studies: CT Head Wo Contrast  Result Date: 05/31/2021 CLINICAL DATA:  Neck mass EXAM: CT  HEAD WITHOUT CONTRAST TECHNIQUE: Contiguous axial images were obtained from the base of the skull through the vertex without intravenous contrast. COMPARISON:  CT chest 05/31/2021 FINDINGS: Brain: No evidence of acute infarction, hemorrhage, hydrocephalus, extra-axial collection or mass lesion/mass effect. Vascular: Limited assessment due to recent intravascular contrast administration. No unexpected calcification. Skull: Normal. Negative for fracture or focal lesion. Sinuses/Orbits: No acute finding. Other: None IMPRESSION: Negative non contrasted CT appearance of the brain. Electronically Signed   By: Jasmine Pang M.D.   On: 05/31/2021 21:33   CT Angio Chest PE W/Cm &/Or Wo Cm  Result Date: 05/31/2021 CLINICAL DATA:  Loss of consciousness EXAM: CT ANGIOGRAPHY CHEST WITH CONTRAST TECHNIQUE: Multidetector CT imaging of the chest was performed using the standard protocol during bolus administration of intravenous contrast. Multiplanar CT image reconstructions and MIPs were obtained to evaluate the vascular anatomy. CONTRAST:  OMNIPAQUE IOHEXOL 350 MG/ML SOLN COMPARISON:  Chest x-ray 05/31/2021  FINDINGS: Cardiovascular: Satisfactory opacification of the pulmonary arteries to the segmental level. Extensive bilateral acute pulmonary emboli, involves the distal main pulmonary arteries bilaterally. Extension of thrombus into right upper lobe segmental vessels, inter lobar pulmonary artery, right middle lobe segmental vessels and right lower lobe segmental and subsegmental vessels. Thrombus on the left extends into left upper and lower lobe segmental and subsegmental vessels. Positive for right heart strain with RV LV ratio of 2. Left ventricular walls appear thickened. Cardiac size is normal. No pericardial effusion. Mediastinum/Nodes: Midline trachea. 4.3 cm heterogenous mass at the thyroid isthmus and right lobe. No suspicious adenopathy. Esophagus within normal limits Lungs/Pleura: Lungs are clear. No pleural effusion or pneumothorax. Upper Abdomen: No acute abnormality. Musculoskeletal: No chest wall abnormality. No acute or significant osseous findings. Review of the MIP images confirms the above findings. IMPRESSION: Positive for acute bilateral PE with CT evidence of right heart strain (RV/LV Ratio = 2.0) consistent with at least submassive (intermediate risk) PE. The presence of right heart strain has been associated with an increased risk of morbidity and mortality. Please refer to the "PE Focused" order set in EPIC. 4.3 cm heterogenous thyroid mass. Recommend thyroid US (ref: J Am Coll Radiol. 2015 Feb;12(2): 143-50). Critical Value/emergent results were called by telephone at the time of interpretation on 05/31/2021 at 8:36 pm to provider [facility] , who verbally acknowledged these results. Electronically Signed   By: Jasmine Pang M.D.   On: 05/31/2021 20:36   US Venous Img Lower Bilateral  Result Date: 05/31/2021 CLINICAL DATA:  Bilateral lower extremity edema EXAM: BILATERAL LOWER EXTREMITY VENOUS DOPPLER ULTRASOUND TECHNIQUE: Gray-scale sonography with graded compression, as well as  color Doppler and duplex ultrasound were performed to evaluate the lower extremity deep venous systems from the level of the common femoral vein and including the common femoral, femoral, profunda femoral, popliteal and calf veins including the posterior tibial, peroneal and gastrocnemius veins when visible. The superficial great saphenous vein was also interrogated. Spectral Doppler was utilized to evaluate flow at rest and with distal augmentation maneuvers in the common femoral, femoral and popliteal veins. COMPARISON:  12/19/2017 FINDINGS: RIGHT LOWER EXTREMITY Common Femoral Vein: No evidence of thrombus. Normal compressibility, respiratory phasicity and response to augmentation. Saphenofemoral Junction: No evidence of thrombus. Normal compressibility and flow on color Doppler imaging. Profunda Femoral Vein: No evidence of thrombus. Normal compressibility and flow on color Doppler imaging. Femoral Vein: No evidence of thrombus. Normal compressibility, respiratory phasicity and response to augmentation. Popliteal Vein: No evidence of thrombus. Normal compressibility, respiratory phasicity and  response to augmentation. Calf Veins: No evidence of thrombus. Normal compressibility and flow on color Doppler imaging. Superficial Great Saphenous Vein: No evidence of thrombus. Normal compressibility. Venous Reflux:  None. Other Findings:  None. LEFT LOWER EXTREMITY Common Femoral Vein: No evidence of thrombus. Normal compressibility, respiratory phasicity and response to augmentation. Saphenofemoral Junction: No evidence of thrombus. Normal compressibility and flow on color Doppler imaging. Profunda Femoral Vein: No evidence of thrombus. Normal compressibility and flow on color Doppler imaging. Femoral Vein: No evidence of thrombus. Normal compressibility, respiratory phasicity and response to augmentation. Popliteal Vein: No evidence of thrombus. Normal compressibility, respiratory phasicity and response to  augmentation. Calf Veins: The left peroneal and posterior tibial veins are patent on the presented images. There is, however, nonocclusive thrombus seen within several gastrocnemius veins of the left calf. Superficial Great Saphenous Vein: There is nonocclusive thrombus within the central greater saphenous vein roughly 12 mm from the saphenofemoral junction. There is no extension of this thrombus into the common femoral vein itself. There is occlusive, acute expansile thrombus within the greater saphenous vein slightly more distally within the proximal thigh. The greater saphenous vein remains thrombosed to at least the level of the knee. Venous Reflux:  None. Other Findings:  None. IMPRESSION: No femoropopliteal DVT identified. Superficial venous thrombosis of the greater saphenous vein to within 12 mm of the saphenofemoral junction. No extension of thrombus identified through the saphenofemoral junction itself. Infrapopliteal DVT involving the left gastrocnemius veins. No extension into the popliteal vein. Electronically Signed   By: Helyn Numbers MD   On: 05/31/2021 21:52   DG Chest Portable 1 View  Result Date: 05/31/2021 CLINICAL DATA:  Heart palpitation EXAM: PORTABLE CHEST 1 VIEW COMPARISON:  None. FINDINGS: The heart size and mediastinal contours are within normal limits. Both lungs are clear. The visualized skeletal structures are unremarkable. IMPRESSION: No active disease. Electronically Signed   By: Jasmine Pang M.D.   On: 05/31/2021 19:38   ECHOCARDIOGRAM COMPLETE  Result Date: 06/01/2021    ECHOCARDIOGRAM REPORT   Patient Name:   [facility] Date of Exam: 06/01/2021 Medical Rec #:  [id number]      Height:       65.0 in Accession #:    [id number]     Weight:       218.9 lb Date of Birth:  01/24/1981      BSA:          2.056 m Patient Age:    40 years       BP:           156/109 mmHg Patient Gender: F              HR:           98 bpm. Exam Location:  Inpatient Procedure: 2D Echo, Color  Doppler and Cardiac Doppler STAT ECHO Indications:    I26.02 Pulmonary embolus  History:        Patient has no prior history of Echocardiogram examinations.  Sonographer:    Irving Burton Senior RDCS Referring Phys: [id number] Virl Axe CLARK IMPRESSIONS  1. Right ventricular systolic function is moderately reduced. The right ventricular size is moderately enlarged. There is normal pulmonary artery systolic pressure. The estimated right ventricular systolic pressure is 35.4 mmHg.There is the interventricular septum is flattened in systole and diastole, consistent with right ventricular pressure and volume overload.  2. The inferior vena cava is dilated in size with <50% respiratory variability, suggesting right atrial pressure of 15 mmHg.  3. Left ventricular ejection fraction, by estimation, is 60%. The left ventricle has normal function. The left ventricle has no regional wall motion abnormalities. Indeterminate diastolic filling due to E-A fusion.  4. The mitral valve is normal in structure. No evidence of mitral valve regurgitation. No evidence of mitral stenosis.  5. The aortic valve is tricuspid. Aortic valve regurgitation is not visualized. No aortic stenosis is present. Comparison(s): No prior Echocardiogram. Conclusion(s)/Recommendation(s): Given history of pulmonary embolism, findings are consistent with acute right ventricular strain; clinical correlation advised. Reaching out to primary team. FINDINGS  Left Ventricle: Left ventricular ejection fraction, by estimation, is 60%. The left ventricle has normal function. The left ventricle has no regional wall motion abnormalities. The left ventricular internal cavity size was small. There is no left ventricular hypertrophy. The interventricular septum is flattened in systole and diastole, consistent with right ventricular pressure and volume overload. Indeterminate diastolic filling due to E-A fusion. Right Ventricle: The right ventricular size is moderately enlarged. No  increase in right ventricular wall thickness. Right ventricular systolic function is moderately reduced. There is normal pulmonary artery systolic pressure. The tricuspid regurgitant velocity is 2.26 m/s, and with an assumed right atrial pressure of 15 mmHg, the estimated right ventricular systolic pressure is 35.4 mmHg. Left Atrium: Left atrial size was normal in size. Right Atrium: Right atrial size was normal in size. Pericardium: There is no evidence of pericardial effusion. Mitral Valve: The mitral valve is normal in structure. No evidence of mitral valve regurgitation. No evidence of mitral valve stenosis. Tricuspid Valve: The tricuspid valve is normal in structure. Tricuspid valve regurgitation is mild . No evidence of tricuspid stenosis. Aortic Valve: The aortic valve is tricuspid. Aortic valve regurgitation is not visualized. No aortic stenosis is present. Pulmonic Valve: The pulmonic valve was not well visualized. Pulmonic valve regurgitation is not visualized. No evidence of pulmonic stenosis. Aorta: The aortic root, ascending aorta, aortic arch and descending aorta are all structurally normal, with no evidence of dilitation or obstruction. Venous: The inferior vena cava is dilated in size with less than 50% respiratory variability, suggesting right atrial pressure of 15 mmHg. IAS/Shunts: The atrial septum is grossly normal.  LEFT VENTRICLE PLAX 2D LVIDd:         3.60 cm LVIDs:         2.70 cm LV PW:         0.80 cm LV IVS:        0.70 cm LVOT diam:     2.10 cm LV SV:         42 LV SV Index:   20 LVOT Area:     3.46 cm  RIGHT VENTRICLE RV S prime:     5.11 cm/s TAPSE (M-mode): 1.1 cm LEFT ATRIUM             Index       RIGHT ATRIUM           Index LA diam:        2.30 cm 1.12 cm/m  RA Area:     14.90 cm LA Vol (A2C):   37.0 ml 18.00 ml/m RA Volume:   34.90 ml  16.98 ml/m LA Vol (A4C):   37.4 ml 18.19 ml/m LA Biplane Vol: 38.7 ml 18.83 ml/m  AORTIC VALVE LVOT Vmax:   91.00 cm/s LVOT Vmean:  61.150  cm/s LVOT VTI:    0.122 m  AORTA Ao Root diam: 3.10 cm Ao Asc diam:  3.20 cm TRICUSPID VALVE TR  Peak grad:   20.4 mmHg TR Vmax:        226.00 cm/s  SHUNTS Systemic VTI:  0.12 m Systemic Diam: 2.10 cm Riley Lam MD Electronically signed by Riley Lam MD Signature Date/Time: 06/01/2021/9:19:03 AM    Final     Microbiology: Recent Results (from the past 240 hour(s))  Resp Panel by RT-PCR (Flu A&B, Covid) Nasopharyngeal Swab     Status: None   Collection Time: 05/31/21  7:47 PM   Specimen: Nasopharyngeal Swab; Nasopharyngeal(NP) swabs in vial transport medium  Result Value Ref Range Status   SARS Coronavirus 2 by RT PCR NEGATIVE NEGATIVE Final    Comment: (NOTE) SARS-CoV-2 target nucleic acids are NOT DETECTED.  The SARS-CoV-2 RNA is generally detectable in upper respiratory specimens during the acute phase of infection. The lowest concentration of SARS-CoV-2 viral copies this assay can detect is 138 copies/mL. A negative result does not preclude SARS-Cov-2 infection and should not be used as the sole basis for treatment or other patient management decisions. A negative result may occur with  improper specimen collection/handling, submission of specimen other than nasopharyngeal swab, presence of viral mutation(s) within the areas targeted by this assay, and inadequate number of viral copies(<138 copies/mL). A negative result must be combined with clinical observations, patient history, and epidemiological information. The expected result is Negative.  Fact Sheet for Patients:  BloggerCourse.com  Fact Sheet for Healthcare Providers:  SeriousBroker.it  This test is no t yet approved or cleared by the [place] FDA and  has been authorized for detection and/or diagnosis of SARS-CoV-2 by FDA under an Emergency Use Authorization (EUA). This EUA will remain  in effect (meaning this test can be used) for the duration of  the COVID-19 declaration under Section 564(b)(1) of the Act, 21 U.S.C.section 360bbb-3(b)(1), unless the authorization is terminated  or revoked sooner.       Influenza A by PCR NEGATIVE NEGATIVE Final   Influenza B by PCR NEGATIVE NEGATIVE Final    Comment: (NOTE) The Xpert Xpress SARS-CoV-2/FLU/RSV plus assay is intended as an aid in the diagnosis of influenza from Nasopharyngeal swab specimens and should not be used as a sole basis for treatment. Nasal washings and aspirates are unacceptable for Xpert Xpress SARS-CoV-2/FLU/RSV testing.  Fact Sheet for Patients: BloggerCourse.com  Fact Sheet for Healthcare Providers: SeriousBroker.it  This test is not yet approved or cleared by the [place] FDA and has been authorized for detection and/or diagnosis of SARS-CoV-2 by FDA under an Emergency Use Authorization (EUA). This EUA will remain in effect (meaning this test can be used) for the duration of the COVID-19 declaration under Section 564(b)(1) of the Act, 21 U.S.C. section 360bbb-3(b)(1), unless the authorization is terminated or revoked.  Performed at [facility], [address] [place], [place] [address]   MRSA Next Gen by PCR, Nasal     Status: None   Collection Time: 05/31/21 11:05 PM   Specimen: Nasal Mucosa; Nasal Swab  Result Value Ref Range Status   MRSA by PCR Next Gen NOT DETECTED NOT DETECTED Final    Comment: (NOTE) The GeneXpert MRSA Assay (FDA approved for NASAL specimens only), is one component of a comprehensive MRSA colonization surveillance program. It is not intended to diagnose MRSA infection nor to guide or monitor treatment for MRSA infections. Test performance is not FDA approved in patients less than 2 years old. Performed at [facility], [address] [place], [place] [address]  Labs: Basic Metabolic Panel: Recent Labs  Lab 05/31/21 1912 06/01/21 0552  06/02/21 0647 06/03/21 0229 06/04/21 0436  NA 133* 137 141 140 137  K 3.1* 3.7 4.1 3.6 3.9  CL 98 107 108 108 104  CO2 22 22 23 23 23   GLUCOSE 210* 146* 115* 117* 109*  BUN 20 12 11 9 7   CREATININE 0.92 0.72 0.82 0.71 0.82  CALCIUM 9.0 8.4* 8.8* 9.2 9.4  MG  --  2.3 2.3  --   --    Liver Function Tests: Recent Labs  Lab 05/31/21 1912 06/04/21 0436  AST 24 28  ALT 17 33  ALKPHOS 71 73  BILITOT 1.3* 1.0  PROT 8.4* 7.5  ALBUMIN 4.1 3.5   No results for input(s): LIPASE, AMYLASE in the last 168 hours. No results for input(s): AMMONIA in the last 168 hours. CBC: Recent Labs  Lab 05/31/21 1912 06/01/21 0552 06/01/21 1155 06/02/21 0647 06/03/21 0229  WBC 15.1* 11.0* 17.7* 10.5 10.2  HGB 14.8 13.2 14.0 13.2 13.4  HCT 42.0 37.1 41.7 39.1 39.3  MCV 89.7 90.3 93.7 94.4 92.9  PLT 375 293 268 227 284   Cardiac Enzymes: No results for input(s): CKTOTAL, CKMB, CKMBINDEX, TROPONINI in the last 168 hours. BNP: BNP (last 3 results) Recent Labs    05/31/21 1912  BNP 27.3    ProBNP (last 3 results) No results for input(s): PROBNP in the last 8760 hours.  CBG: Recent Labs  Lab 05/31/21 2355  GLUCAP 167*       Signed:  06/02/21 MD, PhD, FACP  [facility] 06/04/2021, 11:37 PM

## 2021-06-04 NOTE — Plan of Care (Signed)
  Problem: Education: Goal: Knowledge of General Education information will improve Description: Including pain rating scale, medication(s)/side effects and non-pharmacologic comfort measures Outcome: Adequate for Discharge   Problem: Health Behavior/Discharge Planning: Goal: Ability to manage health-related needs will improve Outcome: Adequate for Discharge   Problem: Clinical Measurements: Goal: Ability to maintain clinical measurements within normal limits will improve Outcome: Adequate for Discharge Goal: Will remain free from infection Outcome: Adequate for Discharge Goal: Diagnostic test results will improve Outcome: Adequate for Discharge Goal: Respiratory complications will improve Outcome: Adequate for Discharge Goal: Cardiovascular complication will be avoided Outcome: Adequate for Discharge   Problem: Elimination: Goal: Will not experience complications related to bowel motility Outcome: Adequate for Discharge Goal: Will not experience complications related to urinary retention Outcome: Adequate for Discharge   Problem: Pain Managment: Goal: General experience of comfort will improve Outcome: Adequate for Discharge   Problem: Safety: Goal: Ability to remain free from injury will improve Outcome: Adequate for Discharge   Problem: Skin Integrity: Goal: Risk for impaired skin integrity will decrease Outcome: Adequate for Discharge   

## 2021-06-05 ENCOUNTER — Telehealth: Payer: Self-pay

## 2021-06-05 NOTE — Telephone Encounter (Signed)
Rn from 3c was requesting a hospital follow for pt was given 7/29/@9 :15  RN Iris

## 2021-06-08 LAB — ALDOSTERONE + RENIN ACTIVITY W/ RATIO
ALDO / PRA Ratio: 0.5 (ref 0.0–30.0)
Aldosterone: 1.4 ng/dL (ref 0.0–30.0)
PRA LC/MS/MS: 2.748 ng/mL/hr (ref 0.167–5.380)

## 2021-06-09 ENCOUNTER — Other Ambulatory Visit (HOSPITAL_COMMUNITY): Payer: Self-pay

## 2021-06-09 ENCOUNTER — Ambulatory Visit (INDEPENDENT_AMBULATORY_CARE_PROVIDER_SITE_OTHER): Payer: Medicaid Other | Admitting: Internal Medicine

## 2021-06-09 ENCOUNTER — Other Ambulatory Visit: Payer: Self-pay

## 2021-06-09 ENCOUNTER — Encounter: Payer: Self-pay | Admitting: Internal Medicine

## 2021-06-09 VITALS — BP 154/103 | HR 90 | Temp 98.5°F | Ht 65.0 in | Wt 214.7 lb

## 2021-06-09 DIAGNOSIS — I82462 Acute embolism and thrombosis of left calf muscular vein: Secondary | ICD-10-CM | POA: Diagnosis not present

## 2021-06-09 DIAGNOSIS — R600 Localized edema: Secondary | ICD-10-CM

## 2021-06-09 DIAGNOSIS — E876 Hypokalemia: Secondary | ICD-10-CM | POA: Diagnosis not present

## 2021-06-09 DIAGNOSIS — E079 Disorder of thyroid, unspecified: Secondary | ICD-10-CM

## 2021-06-09 DIAGNOSIS — I2609 Other pulmonary embolism with acute cor pulmonale: Secondary | ICD-10-CM

## 2021-06-09 DIAGNOSIS — I1A0 Resistant hypertension: Secondary | ICD-10-CM | POA: Insufficient documentation

## 2021-06-09 DIAGNOSIS — I10 Essential (primary) hypertension: Secondary | ICD-10-CM | POA: Insufficient documentation

## 2021-06-09 DIAGNOSIS — R7303 Prediabetes: Secondary | ICD-10-CM | POA: Diagnosis not present

## 2021-06-09 DIAGNOSIS — Z86718 Personal history of other venous thrombosis and embolism: Secondary | ICD-10-CM | POA: Insufficient documentation

## 2021-06-09 DIAGNOSIS — I82409 Acute embolism and thrombosis of unspecified deep veins of unspecified lower extremity: Secondary | ICD-10-CM | POA: Insufficient documentation

## 2021-06-09 DIAGNOSIS — I159 Secondary hypertension, unspecified: Secondary | ICD-10-CM

## 2021-06-09 MED ORDER — APIXABAN 5 MG PO TABS
5.0000 mg | ORAL_TABLET | Freq: Two times a day (BID) | ORAL | 1 refills | Status: DC
  Filled 2021-06-09: qty 60, 30d supply, fill #0

## 2021-06-09 MED ORDER — AMLODIPINE BESYLATE 10 MG PO TABS
10.0000 mg | ORAL_TABLET | Freq: Every day | ORAL | 0 refills | Status: DC
Start: 2021-06-09 — End: 2021-07-06
  Filled 2021-06-09: qty 30, 30d supply, fill #0

## 2021-06-09 MED ORDER — LOSARTAN POTASSIUM 100 MG PO TABS
100.0000 mg | ORAL_TABLET | Freq: Every day | ORAL | 0 refills | Status: DC
  Filled 2021-06-09: qty 30, 30d supply, fill #0

## 2021-06-09 MED ORDER — SPIRONOLACTONE 25 MG PO TABS
25.0000 mg | ORAL_TABLET | Freq: Every day | ORAL | 0 refills | Status: DC
  Filled 2021-06-09: qty 30, 30d supply, fill #0

## 2021-06-09 MED ORDER — APIXABAN 5 MG PO TABS
5.0000 mg | ORAL_TABLET | Freq: Two times a day (BID) | ORAL | 3 refills | Status: DC
  Filled 2021-06-09 – 2021-07-06 (×3): qty 60, 30d supply, fill #0

## 2021-06-09 MED ORDER — CARVEDILOL 6.25 MG PO TABS
6.2500 mg | ORAL_TABLET | Freq: Two times a day (BID) | ORAL | 1 refills | Status: DC
  Filled 2021-06-09: qty 30, 15d supply, fill #0

## 2021-06-09 NOTE — Assessment & Plan Note (Addendum)
Patient is following up from recent hospital stay due to massive PE.  She was initially hypotensive due to PE, but as therapy was added to treat PE, her blood pressure became elevated.  She was not on any medication before being hospitalized.  Started on coreg 3.125 mg BID, Norvasc 10 mg qd, losartan 100 mg qd, and spironolactone 12.5 mg qd.  Renin/ aldo ratio wnl (0.5) when checked in hospital.  TSH also checked in patient and was wnl.  Her creatinine=0.82, BUN 7, GRF >60 06/04/21.  Patient is not currently on any oral contraceptives.  In office, her BP was elevated at 154/103. Another differential would be sleep apnea  -Recheck BMP today -Titrated Coreg to 6.25 mg BID -Increased Spironolactone to 25 mg qd -Encouraged patient to continue monitoring BP at home and to bring log in -Follow-up in 3 weeks  -Will order sleep study following patient's pending insurance.

## 2021-06-09 NOTE — Assessment & Plan Note (Addendum)
Sabrina Mclaughlin presented to ED on 05/31/21 with near-syncopal symptoms and SpO2 of 80%.  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  Diagnosed with bilateral unprovoked massive PE.  U/s showed infrapopliteal DVT involving the left gastrocnemius and CTa showed submassive pE with right heart strain.  Started on heparin drip and admitted to ICU.  Catheter directed thrombolysis aborted due to clinical deterioration and she received systemic tPA.  Patient was discharged on Eliquis starter pack.  Has completed that and is currently continuing Eliquis 5 mg BID.  Today she states that she does not have any shortness of breath and has gradually been returning to her normal activities. No recent trauma/ surgery or long distance travel, with no previous history of DVT and denies family history of venous thromboembolism or hypercoagulability.  She does not take oral contraceptives at this time.  She is currently working on being approved for [employer] and is agreeable for further diagnostic testing once her insurance is approved. -Continue Eliquis 5 mg -Will eventually order hypercoagulability panel pending insurance

## 2021-06-09 NOTE — Progress Notes (Signed)
   CC: Establishing care following hospitalization for massive PE  HPI:  [name] is a 40 y.o. with medical history as below presenting to [facility] to establish care.  She was recently hospitalized due to a massive unprovoked pulmonary embolism.    Please see problem-based list for further details, assessments, and plans.  Past Medical History:  Diagnosis Date   History of DVT (deep vein thrombosis)    Hypotension 05/31/2021   Review of Systems:  As per HPI  Family hx: Father with history and massive MI at 10, which is passed away from.  Her paternal grandfather also died due to a MI.  Mother with history of Diabetes.  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  She also works part-time.  She has never smoked and drinks EtOH every once in awhile.  She states that she has gained significant weight in the past two years and attributes this to her diet and exercise habits getting worse due to being busy taking care of her children in addition to working and going to school.  She is currently applying for Medicaid.  Psurgical Hx: no previous surgeries  Allergies: No known allergies  Physical Exam:  Vitals:   06/09/21 0925 06/09/21 0930  BP: (!) 154/102 (!) 154/103  Pulse: 92 90  Temp: 98.5 F (36.9 C)   TempSrc: Oral   SpO2: 100%   Weight: 214 lb 11.2 oz (97.4 kg)   Height: 5\' 5"  (1.651 m)    General: Well-developed, well-nourished, obese HENT: NCAT, non-tender thyromegaly Eyes: conjunctiva clear, no proptosis present CV: normal rate, no murmurs Pulm: CTAB, normal pulmonary effort GI: no tenderness present, bowel sounds normal MSK: symmetric bilateral 1+ lower extremity edema, varicosities present on shins bilaterally Skin: warm and dry, no erythema present at calfs bilaterally Psych: Normal mood and affect  Assessment & Plan:   See Encounters Tab for problem based charting.  Patient seen with Dr.  

## 2021-06-09 NOTE — Assessment & Plan Note (Signed)
Patient presents with chronic history for past few years of peripheral edema. Her edema is symmetric and 1+ today, -May start using compression stocking if edema becomes bothersome.

## 2021-06-09 NOTE — Assessment & Plan Note (Signed)
Recent A1c= 5.8 in hospital 05/2021 Patient states that over last two years she has had a change in her diet and exercise routine due to school and work schedules.  She is trying to make lifestyle modifications to help with the weight she has gained in the last two years. -Will repeat A1c in a few months

## 2021-06-09 NOTE — Assessment & Plan Note (Signed)
Her K+ was 3.1 during her recent hospital stay.  She was started on Spironolactone 12.5 qd. -repeat BMP today -Increased spironolactone to 25 qd

## 2021-06-09 NOTE — Assessment & Plan Note (Addendum)
4.3 cm heterogenous thyroid mass seen on CTA PE.  Patient states that she was aware of this thyroid mass and last follow-up was a biopsy that was completed about 6 years ago.  At that time she was told to continue imaging for next 5 years if mass continues to be stable. -Once insurance approved will order thyroid ultrasound

## 2021-06-09 NOTE — Assessment & Plan Note (Signed)
Patient with history of superficial venous thrombosis present to ED on 05/31/2021 due to near-syncopal event.  For the week prior she has pain and "heaviness" in her left leg, but she did not seek medical care at that time due to taking care of her children, working, and going to school.  Imaging showed Infrapopliteal DVT involving the left gastrocnemius veins with no extension into the popliteal vein.  CTA showed massive bilateral pulmonary embolism.  In clinic today, she states that the pain in her legs is no longer present.  She denies travel, recent surgery, or oral contraceptive usage.   -Continue on Eliquis 5 mg BID -Will work-up further once patient has insurance

## 2021-06-09 NOTE — Patient Instructions (Addendum)
[name], it was a pleasure seeing you today!  Today we discussed: Recent hospital stay due to blood clot- Please continue taking your Eliquis as prescribed.  Once your insurance is appoved, we can get some additional blood work to continue trying to determine what could have caused your blood clot.  If you have any symptoms of leg pain or shortness of breath, please go to the ED. Thyroid- once your insurance is approved we will get an ultrasound on your thyroid  Today we are checking some blood work as well to follow up on your electrolytes. Please continue to log your blood pressure.  We are increasing your coreg to 6.25 twice daily and also increasing the spironolactone to 25 mg once daily today and I would like to follow-up with your about your blood pressure in 3 weeks.   I have ordered the following labs today:  Lab Orders  No laboratory test(s) ordered today     Tests ordered today:  BMP  Referrals ordered today:   Referral Orders  No referral(s) requested today     I have ordered the following medication/changed the following medications:   Stop the following medications: There are no discontinued medications.   Start the following medications: No orders of the defined types were placed in this encounter.    Follow-up: 2-3 months   Please make sure to arrive 15 minutes prior to your next appointment. If you arrive late, you may be asked to reschedule.   We look forward to seeing you next time. Please call our clinic at [phone] if you have any questions or concerns. The best time to call is Monday-Friday from 9am-4pm, but there is someone available 24/7. If after hours or the weekend, call the main hospital number and ask for the Internal Medicine Resident On-Call. If you need medication refills, please notify your pharmacy one week in advance and they will send [name] a request.  Thank you for letting us take part in your care. Wishing you the best!  Thank  you, Dr. Sloan Leiter

## 2021-06-10 LAB — BMP8+ANION GAP
Anion Gap: 18 mmol/L (ref 10.0–18.0)
BUN/Creatinine Ratio: 18 (ref 9–23)
BUN: 13 mg/dL (ref 6–24)
CO2: 20 mmol/L (ref 20–29)
Calcium: 10.1 mg/dL (ref 8.7–10.2)
Chloride: 99 mmol/L (ref 96–106)
Creatinine, Ser: 0.71 mg/dL (ref 0.57–1.00)
Glucose: 86 mg/dL (ref 65–99)
Potassium: 4.8 mmol/L (ref 3.5–5.2)
Sodium: 137 mmol/L (ref 134–144)
eGFR: 109 mL/min/{1.73_m2} (ref >59)

## 2021-06-11 NOTE — Progress Notes (Signed)
Internal Medicine Clinic Attending  I saw and evaluated the patient.  I personally confirmed the key portions of the history and exam documented by Dr. Sloan Leiter and I reviewed pertinent patient test results.  The assessment, diagnosis, and plan were formulated together and I agree with the documentation in the resident's note.  Young woman with unprovoked DVT/massive PE, w/u pending insurance.  Length of anticoagulation likely to be lifelong.  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  Renin/aldosterone levels normal in hospital, OSA will be investigated when financially able. She also has fam hx of premature CAD and risk factor modification will remain a priority.  We are pleased that she is establishing care in the [facility].

## 2021-06-12 ENCOUNTER — Telehealth: Payer: Self-pay

## 2021-06-12 NOTE — Telephone Encounter (Signed)
Spoke with pt about assistance for eliquis medication. Pt has about 3 weeks worth of medication left and would like the BMS application mailed to her home. Pt agreed that if she needs medication before assistance is approved, we can tx the rx to [facility] pharmacy for $10.

## 2021-06-12 NOTE — Progress Notes (Signed)
Patient called regarding her lab results.  Patient aware.

## 2021-06-27 NOTE — Telephone Encounter (Signed)
Submitted application for ELIQUIS to [employer] ([employer]) for patient assistance.   Phone: [phone]  

## 2021-06-30 ENCOUNTER — Encounter: Payer: Medicaid Other | Admitting: Internal Medicine

## 2021-07-03 ENCOUNTER — Encounter: Payer: Self-pay | Admitting: Internal Medicine

## 2021-07-03 NOTE — Progress Notes (Deleted)
Hx of submassive PE/ DVT Eliquis 5 mg BID- continue anticoagulation indef HTN On Losartan 100 mg, Spironolactone 25 mg, Carvedilol 6.25 mg BID, Norvasc 10 mg qd Home Bps?

## 2021-07-04 ENCOUNTER — Encounter: Payer: Medicaid Other | Admitting: Internal Medicine

## 2021-07-06 ENCOUNTER — Ambulatory Visit (INDEPENDENT_AMBULATORY_CARE_PROVIDER_SITE_OTHER): Payer: Medicaid Other | Admitting: Internal Medicine

## 2021-07-06 ENCOUNTER — Other Ambulatory Visit: Payer: Self-pay

## 2021-07-06 ENCOUNTER — Encounter: Payer: Self-pay | Admitting: Internal Medicine

## 2021-07-06 ENCOUNTER — Other Ambulatory Visit (HOSPITAL_COMMUNITY): Payer: Self-pay

## 2021-07-06 VITALS — BP 136/94 | HR 75 | Temp 98.1°F | Wt 214.6 lb

## 2021-07-06 DIAGNOSIS — I159 Secondary hypertension, unspecified: Secondary | ICD-10-CM

## 2021-07-06 DIAGNOSIS — I872 Venous insufficiency (chronic) (peripheral): Secondary | ICD-10-CM | POA: Diagnosis not present

## 2021-07-06 DIAGNOSIS — Z86718 Personal history of other venous thrombosis and embolism: Secondary | ICD-10-CM | POA: Diagnosis not present

## 2021-07-06 DIAGNOSIS — I82462 Acute embolism and thrombosis of left calf muscular vein: Secondary | ICD-10-CM | POA: Diagnosis not present

## 2021-07-06 DIAGNOSIS — E079 Disorder of thyroid, unspecified: Secondary | ICD-10-CM

## 2021-07-06 MED ORDER — SPIRONOLACTONE 25 MG PO TABS
25.0000 mg | ORAL_TABLET | Freq: Every day | ORAL | 11 refills | Status: DC
  Filled 2021-07-06 – 2021-07-13 (×3): qty 30, 30d supply, fill #0
  Filled 2021-08-09: qty 30, 30d supply, fill #1
  Filled 2021-09-08: qty 30, 30d supply, fill #2
  Filled 2021-10-09: qty 30, 30d supply, fill #3
  Filled 2021-11-09: qty 30, 30d supply, fill #4
  Filled 2021-12-12: qty 30, 30d supply, fill #5
  Filled 2022-01-11: qty 30, 30d supply, fill #6
  Filled 2022-02-15: qty 30, 30d supply, fill #7
  Filled 2022-03-19: qty 30, 30d supply, fill #8
  Filled 2022-04-22: qty 30, 30d supply, fill #9
  Filled 2022-05-23: qty 30, 30d supply, fill #10
  Filled 2022-06-24: qty 30, 30d supply, fill #11

## 2021-07-06 MED ORDER — AMLODIPINE BESYLATE 10 MG PO TABS
10.0000 mg | ORAL_TABLET | Freq: Every day | ORAL | 11 refills | Status: DC
Start: 2021-07-06 — End: 2022-07-19
  Filled 2021-07-06: qty 30, 30d supply, fill #0
  Filled 2021-08-09: qty 30, 30d supply, fill #1
  Filled 2021-09-08: qty 30, 30d supply, fill #2
  Filled 2021-10-09: qty 30, 30d supply, fill #3
  Filled 2021-11-09: qty 30, 30d supply, fill #4
  Filled 2021-12-12: qty 30, 30d supply, fill #5
  Filled 2022-01-11: qty 30, 30d supply, fill #6
  Filled 2022-02-15: qty 30, 30d supply, fill #7
  Filled 2022-03-19: qty 30, 30d supply, fill #8
  Filled 2022-04-22: qty 30, 30d supply, fill #9
  Filled 2022-05-23: qty 30, 30d supply, fill #10
  Filled 2022-06-24: qty 30, 30d supply, fill #11

## 2021-07-06 MED ORDER — LOSARTAN POTASSIUM 100 MG PO TABS
100.0000 mg | ORAL_TABLET | Freq: Every day | ORAL | 11 refills | Status: DC
Start: 2021-07-06 — End: 2022-07-19
  Filled 2021-07-06: qty 30, 30d supply, fill #0
  Filled 2021-08-09: qty 30, 30d supply, fill #1
  Filled 2021-09-08: qty 30, 30d supply, fill #2
  Filled 2021-10-09: qty 30, 30d supply, fill #3
  Filled 2021-11-09: qty 30, 30d supply, fill #4
  Filled 2021-12-12: qty 30, 30d supply, fill #5
  Filled 2022-01-11: qty 30, 30d supply, fill #6
  Filled 2022-02-15: qty 30, 30d supply, fill #7
  Filled 2022-03-19: qty 30, 30d supply, fill #8
  Filled 2022-04-22: qty 30, 30d supply, fill #9
  Filled 2022-05-23: qty 30, 30d supply, fill #10
  Filled 2022-06-24: qty 30, 30d supply, fill #11

## 2021-07-06 MED ORDER — CARVEDILOL 6.25 MG PO TABS
6.2500 mg | ORAL_TABLET | Freq: Two times a day (BID) | ORAL | 6 refills | Status: DC
Start: 2021-07-06 — End: 2022-02-15
  Filled 2021-07-06: qty 60, 30d supply, fill #0
  Filled 2021-08-09: qty 60, 30d supply, fill #1
  Filled 2021-09-08: qty 60, 30d supply, fill #2
  Filled 2021-10-09: qty 60, 30d supply, fill #3
  Filled 2021-11-09: qty 60, 30d supply, fill #4
  Filled 2021-12-12: qty 60, 30d supply, fill #5
  Filled 2022-01-11: qty 60, 30d supply, fill #6

## 2021-07-06 NOTE — Assessment & Plan Note (Addendum)
Patient presents today with well-controlled blood pressure.  Following hospitalization in July for massive PE, she has had resistant HTN.  At hospital, Aldosterone wnl and renin wnl.  She is currently on losartan 100 mg, Amlodipine 10 mg, Spironolactone 25 mg, carvedilol 6.25 mg BID.  In clinic, BP was well-controlled at 136/94.  Patient has been recording BP in AM and PM.  BP at home has been 124- 155/ 70-90.  She notes that she has been told she snores in the past and occasionally wakes herself up due to this.  Will order to sleep study to see if patient has sleep apnea.  This could lead to elevated Blood pressure.  Patient has also been trying to loss weight, this will also help with blood pressure.  She is having significant peripheral edema.  May consider decreasing or changing amlodipine in future.  She states that she has had edema for years before starting amlodipine. Plan: -Continue losartan 100 mg, amlodipine 10 mg, spironolactone 25 mg, and carvedilol 6.25 BID, refilled medications -Sleep study ordered

## 2021-07-06 NOTE — Assessment & Plan Note (Signed)
Patient presents with bilateral lower extremity swelling.  She states that she has had multiple superficial DVTs in the past and that her legs have been swollen for years.  She used to wear compression stockings on one leg in past and did not find this to be comfortable.  Ulcer today was non-erythematous, 1 cmx 1 cm. Picture under media.  Hydrogel bandage applied and patient instructed to leave this on for 5-7 days, remove bandage and call if ulcer looks worse.  She was given second bandage and instructed to follow-up in one month. Plan: -hydrogel applied with spare for next application -instructions given on where to pick up more hydrogel -asked patient to start wearing compression socks -follow-up in one month

## 2021-07-06 NOTE — Assessment & Plan Note (Signed)
Patient recently approved for insurance.  Will order ultrasound to evaluate thyroid mass Plan: -Thyroid Ultrasound

## 2021-07-06 NOTE — Patient Instructions (Addendum)
[name], it was a pleasure seeing you today!  Today we discussed: DVT/ PE- You have refills on Eliquis at the [facility] pharmacy.  I have ordered some blood work to help [name] understand why you are having recurrent blood clots.   Venous stasis ulcer- We applied a hydrogel (silicone gel adhesive composite hydrocellular foam dressing).  Please remove this in 4-5 days.  If the ulcer looks worse, call the clinic.  Otherwise put the second hydrogel and follow-up with [name] in one month High blood pressure- your blood pressure in clinic was controlled at 136/94! Thank you for bringing in your blood pressure log as well, it showed well-controlled blood pressure.  Please continue monitoring your blood pressure once a day and bring log in next month.  I ordered a sleep study due to your history of snoring.  Sleep apnea can increase your blood pressure.  Please continue trying to lose weight, as this can also help with your blood pressure. I have also sent in your medications to the [facility] outpatient pharmacy Thyroid- I've ordered thyroid ultrasound to re evaluate the mass on your thyroid  I have ordered the following labs today:  Lab Orders         Hypercoagulable Panel, Comprehensive       Tests ordered today:  Hypercoagulable panel, US thyroid, and sleep study  Referrals ordered today:   Referral Orders  No referral(s) requested today     I have ordered the following medication/changed the following medications:   Stop the following medications: Medications Discontinued During This Encounter  Medication Reason   amLODipine (NORVASC) 10 MG tablet Reorder   losartan (COZAAR) 100 MG tablet Reorder   spironolactone (ALDACTONE) 25 MG tablet Reorder   carvedilol (COREG) 6.25 MG tablet      Start the following medications: Meds ordered this encounter  Medications   carvedilol (COREG) 6.25 MG tablet    Sig: Take 1 tablet (6.25 mg total) by mouth 2 (two) times daily with a  meal.    Dispense:  60 tablet    Refill:  6   amLODipine (NORVASC) 10 MG tablet    Sig: Take 1 tablet (10 mg total) by mouth daily.    Dispense:  30 tablet    Refill:  11   losartan (COZAAR) 100 MG tablet    Sig: Take 1 tablet (100 mg total) by mouth daily.    Dispense:  30 tablet    Refill:  11   spironolactone (ALDACTONE) 25 MG tablet    Sig: Take 1 tablet (25 mg total) by mouth daily.    Dispense:  30 tablet    Refill:  11     Follow-up:  1 month    Please make sure to arrive 15 minutes prior to your next appointment. If you arrive late, you may be asked to reschedule.   We look forward to seeing you next time. Please call our clinic at [phone] if you have any questions or concerns. The best time to call is Monday-Friday from 9am-4pm, but there is someone available 24/7. If after hours or the weekend, call the main hospital number and ask for the Internal Medicine Resident On-Call. If you need medication refills, please notify your pharmacy one week in advance and they will send [name] a request.  Thank you for letting us take part in your care. Wishing you the best!  Thank you, Dr. [facility]

## 2021-07-06 NOTE — Assessment & Plan Note (Signed)
Patient presents today for follow-up after hospitalization for massive PE and DVT in July.  Today she states that she has more energy and has returned to work.  The swelling in her legs has increased with more activity.  She does not currently wear compression stockings.  Patient recently was approved for medicaid.  Will proceed with hypercoagulability work-up. 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 Plan: -Ordered hypercoagulability panel -Continue Eliquis 5 mg BID

## 2021-07-06 NOTE — Progress Notes (Signed)
   CC: follow-up for hx of PE/DVT, HTN, Venous stasis ulcer, and thyroid mass.  HPI:  [name] is a 40 y.o. with medical history as below presenting to [facility] for follow-up of hx of PE/DVT, HTN, Venous stasis ulcer, and thyroid mass.  Please see problem-based list for further details, assessments, and plans.  Past Medical History:  Diagnosis Date   History of DVT (deep vein thrombosis)    Hypokalemia 05/31/2021   Hypotension 05/31/2021   Physical Exam:  Vitals:   07/06/21 1323 07/06/21 1407  BP: (!) 142/87 (!) 136/94  Pulse: 80 75  Temp: 98.1 F (36.7 C)   TempSrc: Oral   SpO2: 98%   Weight: 214 lb 9.6 oz (97.3 kg)     General: well-developed, well-nourished HENT: NCAT, no scars noted Eyes:sclera no-icteric, conjunctiva clear CV:No m,r,g rate normal Pulm:CTAB, pulmonary effort normal GI:no tenderness present, bowel sounds normal MSK:1+peripheral non-pitting edema present that is symmetric bilaterally, ulcer present on right calf posteriorly, 1cmx1cm, no surrounding erythma      Skin: warm and dry Psych: normal mood and affect  Assessment & Plan:   See Encounters Tab for problem based charting.  Patient seen with Dr. Heide Spark

## 2021-07-13 ENCOUNTER — Other Ambulatory Visit (HOSPITAL_COMMUNITY): Payer: Self-pay

## 2021-07-13 NOTE — Telephone Encounter (Signed)
Received notification from [employer]) regarding approval for [employer]. Patient assistance approved from 06/29/21 to 06/28/22.  Medication will ship to pt's home  Phone: [phone]

## 2021-07-22 LAB — HYPERCOAGULABLE PANEL, COMPREHENSIVE
APTT: 24.7 s
AT III Act/Nor PPP Chro: 123 %
Act. Prt C Resist w/FV Defic.: 2.8 ratio
Anticardiolipin Ab, IgG: <10 [GPL'U]
Anticardiolipin Ab, IgM: 12 [MPL'U]
Beta-2 Glycoprotein I, IgA: <10 SAU
Beta-2 Glycoprotein I, IgG: <10 SGU
Beta-2 Glycoprotein I, IgM: <10 SMU
DRVVT Confirm Seconds: 40.9 s
DRVVT Ratio: 1.2 ratio
DRVVT Screen Seconds: 53.6 s — ABNORMAL HIGH
Factor VII Antigen**: 118 %
Factor VIII Activity: 189 % — ABNORMAL HIGH
Hexagonal Phospholipid Neutral: 12 s — ABNORMAL HIGH
Homocysteine: 10.6 umol/L
Prot C Ag Act/Nor PPP Imm: 125 %
Prot S Ag Act/Nor PPP Imm: 91 %
Protein C Ag/FVII Ag Ratio**: 1.1 ratio
Protein S Ag/FVII Ag Ratio**: 0.8 ratio

## 2021-07-24 ENCOUNTER — Telehealth: Payer: Self-pay | Admitting: Internal Medicine

## 2021-07-24 NOTE — Telephone Encounter (Signed)
Called patient to review results from 07/06/21.  Left a voicemail for her to call clinic if she had further questions.  Will reach out again tomorrow.

## 2021-07-26 ENCOUNTER — Telehealth: Payer: Self-pay

## 2021-07-26 ENCOUNTER — Encounter: Payer: Self-pay | Admitting: Internal Medicine

## 2021-07-26 NOTE — Telephone Encounter (Signed)
LEFT VM FOR PT TO FOLLOW UP WITH [employer] FOR SHIPMENT OF ELIQUIS. COMPANY HAS BEEN REACHING OUT SINCE 07/13/21. SHE CAN CONTACT THEM DIRECTLY AT [phone]

## 2021-08-09 ENCOUNTER — Other Ambulatory Visit (HOSPITAL_COMMUNITY): Payer: Self-pay

## 2021-08-10 ENCOUNTER — Other Ambulatory Visit: Payer: Self-pay

## 2021-08-10 ENCOUNTER — Telehealth: Payer: Self-pay | Admitting: *Deleted

## 2021-08-10 ENCOUNTER — Ambulatory Visit (HOSPITAL_COMMUNITY)
Admission: RE | Admit: 2021-08-10 | Discharge: 2021-08-10 | Disposition: A | Discharge status: Home / Self Care | Payer: Medicaid Other | Source: Ambulatory Visit | Attending: Student in an Organized Health Care Education/Training Program | Admitting: Student in an Organized Health Care Education/Training Program

## 2021-08-10 DIAGNOSIS — I889 Nonspecific lymphadenitis, unspecified: Secondary | ICD-10-CM

## 2021-08-10 DIAGNOSIS — E079 Disorder of thyroid, unspecified: Secondary | ICD-10-CM | POA: Diagnosis present

## 2021-08-10 NOTE — Telephone Encounter (Signed)
Call from [facility] - stated US of thyroid completed and read. Result is in Epic.

## 2021-08-10 NOTE — Telephone Encounter (Addendum)
/  Patient regards to thyroid ultrasound.  Was not able to reach patient at that time.  Left voicemail instructing patient to call clinic back.  I will try again tomorrow.  ADDENDUM 09/30: Was able to reach patient today discussed results of ultrasound thyroid and need to reimage her neck in 8 to 12 weeks.  Placed order for neck ultrasound in 8 to 12 weeks.

## 2021-08-10 NOTE — Progress Notes (Signed)
Internal Medicine Clinic Attending  I saw and evaluated the patient.  I personally confirmed the key portions of the history and exam documented by Dr. Masters and I reviewed pertinent patient test results.  The assessment, diagnosis, and plan were formulated together and I agree with the documentation in the resident's note.  

## 2021-08-11 ENCOUNTER — Encounter: Payer: Self-pay | Admitting: *Deleted

## 2021-08-11 DIAGNOSIS — I889 Nonspecific lymphadenitis, unspecified: Secondary | ICD-10-CM

## 2021-08-11 HISTORY — DX: Nonspecific lymphadenitis, unspecified: I88.9

## 2021-08-11 NOTE — Assessment & Plan Note (Signed)
Thyroid ultrasound due to nodule showed IMPRESSION: 1. Large mixed solid cystic right thyroid nodule does not meet criteria for FNA or imaging surveillance. If this nodule is causing significant symptoms for the patient, fluid aspiration can be performed. 2. Borderline enlarged bilateral neck lymph nodes. Follow-up ultrasound should be performed in 8-12 weeks to again evaluate these lymph nodes.  Called and discussed results with patient and ordered repeat ultrasound of neck in 8 to 12 weeks.

## 2021-08-11 NOTE — Addendum Note (Signed)
Addended by: Lucille Passy on: 08/11/2021 03:34 PM   Modules accepted: Orders

## 2021-09-08 ENCOUNTER — Other Ambulatory Visit (HOSPITAL_COMMUNITY): Payer: Self-pay

## 2021-10-09 ENCOUNTER — Other Ambulatory Visit (HOSPITAL_COMMUNITY): Payer: Self-pay

## 2021-10-16 ENCOUNTER — Ambulatory Visit (HOSPITAL_COMMUNITY): Payer: Medicaid Other | Attending: Internal Medicine

## 2021-10-26 ENCOUNTER — Ambulatory Visit: Payer: Medicaid Other | Admitting: Internal Medicine

## 2021-10-26 ENCOUNTER — Other Ambulatory Visit (HOSPITAL_COMMUNITY)
Admission: RE | Admit: 2021-10-26 | Discharge: 2021-10-26 | Disposition: A | Discharge status: Home / Self Care | Payer: Medicaid Other | Source: Ambulatory Visit | Attending: Internal Medicine | Admitting: Internal Medicine

## 2021-10-26 ENCOUNTER — Encounter: Payer: Self-pay | Admitting: Internal Medicine

## 2021-10-26 VITALS — BP 147/86 | HR 76 | Temp 97.7°F | Wt 226.6 lb

## 2021-10-26 DIAGNOSIS — Z124 Encounter for screening for malignant neoplasm of cervix: Secondary | ICD-10-CM | POA: Insufficient documentation

## 2021-10-26 NOTE — Addendum Note (Signed)
Addended by: Ihor Dow on: 10/26/2021 03:34 PM   Modules accepted: Orders

## 2021-10-26 NOTE — Patient Instructions (Addendum)
Thank you for visiting the Internal Medicine Clinic today. It was a pleasure to meet you! Today we completed a Pap smear.  I will contact you once the results of this test come in.  I have ordered the following for you:  Tests ordered: Pap smear  Follow-up: In one month for a regular check-up.  Remember: If you have any questions or concerns, please call our clinic at [phone] between 9am-5pm and after hours call [phone] and ask for the internal medicine resident on call. If you feel you are having a medical emergency please call 911.  Champ Mungo, DO

## 2021-10-26 NOTE — Assessment & Plan Note (Addendum)
Assessment: Patient reports to clinic today to resume regular Pap smears.  It has been many years since her last screening.  She denies any vaginal discomfort, itching, pain, abnormal discharge.  She is not concerned about pregnancy and does not wish to discuss contraceptive methods at this time. Plan: Pap smear was completed in clinic today.  I will contact the patient with the results once those are in.

## 2021-10-26 NOTE — Progress Notes (Signed)
° °  CC: Pap smear  HPI:  [name] is a 40 y.o. female with past medical history as detailed below who presents today for Pap smear.  Please see problem based charting for detail assessment and plan.  Past Medical History:  Diagnosis Date   History of DVT (deep vein thrombosis)    Hypokalemia 05/31/2021   Hypotension 05/31/2021   Lymphadenitis 08/11/2021   Review of Systems:  Review of Systems  Constitutional:  Negative for chills and fever.  Respiratory:  Negative for cough and shortness of breath.   Cardiovascular:  Negative for chest pain and palpitations.  Gastrointestinal:  Negative for abdominal pain, constipation and diarrhea.  Genitourinary:  Negative for dysuria, flank pain, frequency and urgency.       Negative for abnormal vaginal discharge, itching, dyspareunia.  Neurological:  Negative for dizziness, weakness and headaches.    Physical Exam:  Vitals:   10/26/21 1315  BP: (!) 150/94  Pulse: 78  Temp: 97.7 F (36.5 C)  TempSrc: Oral  SpO2: 98%  Weight: 226 lb 9.6 oz (102.8 kg)   Constitutional: Well-appearing female, no acute distress noted. Cardio: Regular rate and rhythm.  No murmurs, rubs, gallops. Pulm: Clear to auscultation bilaterally. Abdomen: Soft, nondistended. GU: Chaperone present.  Pubic area negative for rash.  No adenopathy noted.  No rash or tenderness noted to external genitalia.  Vaginal canal without injury, discharge, bleeding.  Cervix without discharge or friability; some residual bleeding at cervical os however patient recently ended menstrual cycle. MSK: Negative for extremity edema. Skin: Skin is warm and dry. Neuro: Alert and oriented x3.  No focal deficit noted. Psych: Normal mood and affect.   Assessment & Plan:   See Encounters Tab for problem based charting.  Patient seen with Sabrina Mclaughlin

## 2021-10-30 ENCOUNTER — Telehealth: Payer: Self-pay | Admitting: Internal Medicine

## 2021-10-30 LAB — CYTOLOGY - PAP
Comment: NEGATIVE
Diagnosis: NEGATIVE
Diagnosis: REACTIVE
High risk HPV: NEGATIVE

## 2021-10-30 NOTE — Progress Notes (Signed)
Internal Medicine Clinic Attending  I saw and evaluated the patient.  I personally confirmed the key portions of the history and exam documented by Dr.  Dean  and I reviewed pertinent patient test results.  The assessment, diagnosis, and plan were formulated together and I agree with the documentation in the resident's note.  

## 2021-10-30 NOTE — Telephone Encounter (Signed)
Attempted to contact patient to let her know that results of recent Pap smear were negative.  Left clinic phone number so that she may call back for these results.

## 2021-10-31 ENCOUNTER — Telehealth: Payer: Self-pay

## 2021-10-31 ENCOUNTER — Telehealth: Payer: Self-pay | Admitting: Internal Medicine

## 2021-10-31 NOTE — Telephone Encounter (Signed)
Spoke with patient and informed her that her Pap smear results were negative.  She had no further questions at this time.

## 2021-10-31 NOTE — Telephone Encounter (Signed)
Requesting test results, please call pt back.  

## 2021-11-09 ENCOUNTER — Other Ambulatory Visit (HOSPITAL_COMMUNITY): Payer: Self-pay

## 2021-12-13 ENCOUNTER — Other Ambulatory Visit (HOSPITAL_COMMUNITY): Payer: Self-pay

## 2022-01-11 ENCOUNTER — Other Ambulatory Visit (HOSPITAL_COMMUNITY): Payer: Self-pay

## 2022-02-15 ENCOUNTER — Other Ambulatory Visit: Payer: Self-pay | Admitting: Internal Medicine

## 2022-02-15 ENCOUNTER — Other Ambulatory Visit (HOSPITAL_COMMUNITY): Payer: Self-pay

## 2022-02-15 DIAGNOSIS — I159 Secondary hypertension, unspecified: Secondary | ICD-10-CM

## 2022-02-16 ENCOUNTER — Other Ambulatory Visit (HOSPITAL_COMMUNITY): Payer: Self-pay

## 2022-02-16 MED ORDER — CARVEDILOL 6.25 MG PO TABS
6.2500 mg | ORAL_TABLET | Freq: Two times a day (BID) | ORAL | 2 refills | Status: DC
  Filled 2022-02-16: qty 180, 90d supply, fill #0
  Filled 2022-05-23: qty 180, 90d supply, fill #1
  Filled 2022-08-14: qty 180, 90d supply, fill #2

## 2022-03-12 ENCOUNTER — Encounter: Payer: Medicaid Other | Admitting: Internal Medicine

## 2022-03-19 ENCOUNTER — Other Ambulatory Visit (HOSPITAL_COMMUNITY): Payer: Self-pay

## 2022-03-19 ENCOUNTER — Other Ambulatory Visit: Payer: Self-pay

## 2022-03-19 ENCOUNTER — Encounter: Payer: Self-pay | Admitting: Internal Medicine

## 2022-03-19 ENCOUNTER — Ambulatory Visit: Payer: Medicaid Other | Admitting: Internal Medicine

## 2022-03-19 VITALS — BP 145/82 | HR 65 | Temp 97.8°F | Ht 65.0 in | Wt 197.7 lb

## 2022-03-19 DIAGNOSIS — Z86711 Personal history of pulmonary embolism: Secondary | ICD-10-CM

## 2022-03-19 DIAGNOSIS — I83813 Varicose veins of bilateral lower extremities with pain: Secondary | ICD-10-CM | POA: Diagnosis not present

## 2022-03-19 DIAGNOSIS — I872 Venous insufficiency (chronic) (peripheral): Secondary | ICD-10-CM

## 2022-03-19 DIAGNOSIS — E079 Disorder of thyroid, unspecified: Secondary | ICD-10-CM | POA: Diagnosis not present

## 2022-03-19 DIAGNOSIS — I889 Nonspecific lymphadenitis, unspecified: Secondary | ICD-10-CM

## 2022-03-19 DIAGNOSIS — R002 Palpitations: Secondary | ICD-10-CM | POA: Insufficient documentation

## 2022-03-19 DIAGNOSIS — I10 Essential (primary) hypertension: Secondary | ICD-10-CM

## 2022-03-19 NOTE — Assessment & Plan Note (Addendum)
Blood pressure slightly elevated in clinic today 145/82.  She has a blood pressure cuff at home but has not recently been checking her blood pressures at home. Denies symptoms of hypotension. Reports recent weight loss, lost ~20 lbs since December.  Patient goal is to come off of her blood pressure medications by continuing her weight loss journey.  Our office had ordered a sleep study for the patient at last appointment.  She reports due to her weight loss she feels she has been snoring less and having less daytime fatigue and feels that she no longer needs the sleep study. ? ?On assessment her blood pressures are elevated above goal today, will have patient check her blood pressures at home daily and record these for comparison.  We will continue her antihypertensive regimen as is for now.  May need to make medication adjustments at next OV if blood pressures remain elevated at home as well as in clinic. ?Plan: ?Continue spironolactone 25 mg daily ?Continue amlodipine 10 mg daily ?Continue Coreg 6.25 mg twice daily ?Continue losartan 100 mg daily ?Follow up in 2 months ?

## 2022-03-19 NOTE — Patient Instructions (Addendum)
Thank you, [name] for allowing [name] to provide your care today. Today we discussed: ? ?Congratulations on your weight loss! ? ?For your palpitations I will call our cardiology department to have them send you a Holter monitor to monitor your heartbeat.  They will send you instructions on how to place and return the monitor. ? 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  Continue taking your Eliquis twice a day.  Once you run out of your prescription in 2 months let us know and we will refill it through [facility] Medicaid. ? ?For varicose veins I will put in a referral to vascular surgery to go over your options. ? ?Your blood pressure was a little elevated today 145/82, at this time continue taking your medications and use your at home blood pressure cuff to document your blood pressures once a day at the same time every day and record these numbers.  Please bring your record of blood pressures and the blood pressure cuff to your next appointment. ? ?My Chart Access: ?[email or web address] ? ?Please follow-up in in 2 months for follow up for palpitations.  We can also refill your Eliquis at this time. If you need an earlier appointment for any reason contact our front desk to move up your appointment. ? ?Please make sure to arrive 15 minutes prior to your next appointment. If you arrive late, you may be asked to reschedule.  ?  ?We look forward to seeing you next time. Please call our clinic at [phone] if you have any questions or concerns. The best time to call is Monday-Friday from 9am-4pm, but there is someone available 24/7. If after hours or the weekend, call the main hospital number and ask for the Internal Medicine Resident On-Call. If you need medication refills, please notify your pharmacy one week in advance and they will send [name] a request. ?  ?Thank you for letting us take part in your care. Wishing you  the best! ? ?Ellison Carwin, MD ?03/19/2022, 11:07 AM ?IM Resident, PGY-1 ? ?

## 2022-03-19 NOTE — Progress Notes (Signed)
?  CC: follow up for PE/DVT, varicose veins, venous ulcer, thyroid nodule ? ?HPI: ? ?[name] is a 41 y.o. female with a past medical history stated below and presents today for follow up for PE/DVT, varicose veins, venous ulcer, thyroid nodule. Please see problem based assessment and plan for additional details. ? ?Past Medical History:  ?Diagnosis Date  ? History of DVT (deep vein thrombosis)   ? Hypokalemia 05/31/2021  ? Hypotension 05/31/2021  ? Lymphadenitis 08/11/2021  ? ? ?Current Outpatient Medications on File Prior to Visit  ?Medication Sig Dispense Refill  ? acetaminophen (TYLENOL) 325 MG tablet Take 1 tablet (325 mg total) by mouth every 4 (four) hours as needed for headache or mild pain.    ? amLODipine (NORVASC) 10 MG tablet Take 1 tablet (10 mg total) by mouth daily. 30 tablet 11  ? apixaban (ELIQUIS) 5 MG TABS tablet Take 1 tablet (5 mg total) by mouth 2 (two) times daily. 60 tablet 3  ? carvedilol (COREG) 6.25 MG tablet Take 1 tablet (6.25 mg total) by mouth 2 (two) times daily with a meal. 180 tablet 2  ? losartan (COZAAR) 100 MG tablet Take 1 tablet (100 mg total) by mouth daily. 30 tablet 11  ? Multiple Vitamins-Minerals (THRIVE FOR LIFE WOMENS) TABS Take 2 tablets by mouth daily. By Level    ? spironolactone (ALDACTONE) 25 MG tablet Take 1 tablet (25 mg total) by mouth daily. 30 tablet 11  ? ?No current facility-administered medications on file prior to visit.  ? ? ?Family History  ?Problem Relation Age of Onset  ? Diabetes Mother   ? Heart attack Father 77  ? Heart attack Paternal Grandfather   ? ?Review of Systems: ?ROS negative except for what is noted on the assessment and plan. ? ?Vitals:  ? 03/19/22 1022  ?BP: (!) 145/82  ?Pulse: 65  ?Temp: 97.8 ?F (36.6 ?C)  ?TempSrc: Oral  ?SpO2: 100%  ?Weight: 197 lb 11.2 oz (89.7 kg)  ?Height: 5\' 5"  (1.651 m)  ? ? ? ?Physical Exam: ?General: Well appearing, caucasian overweight female, NAD ?HENT: normocephalic, atraumatic, normal ears and  nares ?EYES: conjunctiva non-erythematous, no scleral icterus ?CV: regular rate, normal rhythm, no murmurs, rubs, gallops. Trace lower extremity edema.  ?Pulmonary: normal work of breathing on RA, lungs clear to auscultation, no rales, wheezes, rhonchi. ?Abdominal: non-distended, soft, non-tender to palpation, normal BS ?Skin: Warm and dry, R calf venous ulcer appears scabbed over, no signs of infection, appears to be healing well, see image in media tab. Large varicose veins bilaterally.  ?Neurological: ?MS: awake, alert and oriented x3, normal speech and fund of knowledge ?Motor: moves all extremities antigravity ?Psych: normal affect ? ? ? ?Assessment & Plan:  ? ?See Encounters Tab for problem based charting. ? ?Patient discussed with Dr. ? ?Heide Spark, M.D. ?[facility] Internal Medicine, PGY-1 ?Pager: [phone] ?Date 03/19/2022 Time 10:27 AM ? ?

## 2022-03-19 NOTE — Assessment & Plan Note (Signed)
Patient has longstanding history of varicose veins of bilateral lower extremities.  She does have pain that occurs occasionally behind her knees from her varicose veins. Patient reports history of superficial DVTs, is on Eliquis for DVT/PE 05/2021 at which time patient had bilateral massive PE of unclear etiology requiring ICU level care and is on Eliquis indefinitely, hypercoagable panel negative. Patient wondering what other treatment options are available for her varicose veins.  ?Plan: ?-referral to vascular surgery for evaluation for other treatment options for occasionally painful varicose veins ?

## 2022-03-19 NOTE — Assessment & Plan Note (Signed)
Patient reports that she is aware that we had recommended a follow-up ultrasound of the neck to reevaluate the lymph nodes of the neck.  She reports she thought she had a cold around the time of the thyroid ultrasound was performed which caused enlargement of the lymph nodes.  She feels confident that since her cold has resolved the lymph nodes have resolved as well.  At this time she feels she does not need the follow-up ultrasound neck nonthyroid despite this being our recommendation. ?

## 2022-03-19 NOTE — Assessment & Plan Note (Signed)
Thyroid ultrasound showed: IMPRESSION: 1. Large mixed solid cystic right thyroid nodule does not meet criteria for FNA or imaging surveillance. If this nodule is causing significant symptoms for the patient, fluid aspiration can be performed. ? ?TSH 05/2021 2.57 normal ?

## 2022-03-19 NOTE — Assessment & Plan Note (Signed)
July 2022 patient was admitted to the ICU with bilateral unprovoked massive PE with infrapopliteal DVT.  Patient received systemic tPA, discharged on Eliquis 5 mg twice daily.  In the interim she has obtained [place] Medicaid health insurance.  Patient is a non-smoker, not on combination contraceptive.  No family history of hypercoagulability.  Hypercoagulability panel negative.  She has 2 more months of Eliquis starter pack.  Endorses adherence. She will require refills at her next OV in 2 months. ?

## 2022-03-19 NOTE — Assessment & Plan Note (Addendum)
Patient reports 1 year history of intermittent palpitations.  These feel like a butterfly in her chest followed by heartbeat as if the heart is resetting itself.  These have become more frequent in the recent weeks.  They occur randomly and multiple times a day, they occur every day.  She notices them when she is resting during the day.  She does not drink coffee, drinks only 1 cup of tea a day then mostly on caffeinated tea.  Rarely drinks a soda.  No other caffeinated products.  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  Patient reports her father had an MI and paternal grandfather had triple bypass surgery which is why she is mentioning her palpitations today. ? ?On cardiac exam regular rate and rhythm, no murmurs, extra heart sounds.  Last TSH July 2022 2.57 normal.  Given her palpitations are frequent and occurring every day patient would benefit from a Holter monitor to capture these sensations which are possibly cardiac in etiology.  I have reached out to cardiology Master at [facility] cardiology department to set up Holter monitor for the patient.  Instructed patient that she will be receiving the monitor in the mail and getting further instructions on monitor placement and returning the monitor. ?Plan: ?-Holter monitor ?-Follow-up in 2 months to review results ?

## 2022-03-19 NOTE — Assessment & Plan Note (Signed)
Patient reports improvement in her lower extremity swelling.  No pitting edema on exam today.  Reports ulcer on right calf is healing well.  On exam right calf ulcer has scabbed over, no signs of infection, appears to be healing well.  Image in media tab.  Patient does not sleep in a chair.  Is working on her diet and exercise and has lost ~20 pounds in the last few months which she feels has led to improvement in her lower extremity edema. ?Plan: ?-Continue weight loss ?-Continue conservative management including compression hose, elevation of the legs, exercise ?

## 2022-03-20 ENCOUNTER — Encounter: Payer: Self-pay | Admitting: *Deleted

## 2022-03-20 ENCOUNTER — Other Ambulatory Visit: Payer: Self-pay | Admitting: Cardiology

## 2022-03-20 DIAGNOSIS — R002 Palpitations: Secondary | ICD-10-CM

## 2022-03-20 NOTE — Progress Notes (Signed)
Ordered 30 day event monitor for evaluation of palpitations. To be read by Dr. Cristal Deer (dod)  ?

## 2022-03-20 NOTE — Progress Notes (Signed)
Internal Medicine Clinic Attending ? ?Case discussed with Dr. Zinoviev  At the time of the visit.  We reviewed the resident?s history and exam and pertinent patient test results.  I agree with the assessment, diagnosis, and plan of care documented in the resident?s note.  ?

## 2022-03-20 NOTE — Progress Notes (Signed)
Patient ID: Sabrina Mclaughlin, female   DOB: 1980-11-24, 41 y.o.   MRN: [id number] ?Patient enrolled for [employer] to ship a 30 day cardiac event monitor to the patients address on file. ? ?Letter with instructions mailed to patient. ?

## 2022-04-21 ENCOUNTER — Ambulatory Visit (INDEPENDENT_AMBULATORY_CARE_PROVIDER_SITE_OTHER): Payer: Medicaid Other

## 2022-04-21 DIAGNOSIS — R002 Palpitations: Secondary | ICD-10-CM | POA: Diagnosis not present

## 2022-04-23 ENCOUNTER — Other Ambulatory Visit (HOSPITAL_COMMUNITY): Payer: Self-pay

## 2022-04-30 ENCOUNTER — Ambulatory Visit (HOSPITAL_BASED_OUTPATIENT_CLINIC_OR_DEPARTMENT_OTHER): Payer: Medicaid Other | Admitting: Cardiology

## 2022-05-23 ENCOUNTER — Other Ambulatory Visit (HOSPITAL_COMMUNITY): Payer: Self-pay

## 2022-05-24 ENCOUNTER — Other Ambulatory Visit: Payer: Self-pay | Admitting: *Deleted

## 2022-05-24 DIAGNOSIS — I872 Venous insufficiency (chronic) (peripheral): Secondary | ICD-10-CM

## 2022-05-28 NOTE — Progress Notes (Signed)
Requested by:  Masters, Psychiatric nurse, DO [address] [place],  [place] [address]  Reason for consultation: bilateral varicose veins    History of Present Illness   Sabrina Mclaughlin is a 41 y.o. ([birth year]) female who presents for evaluation of bilateral varicose veins and lower extremity edema.  The patient states that she has dealt with bilateral lower extremity edema and varicose veins for at least 10 years.  She states they started getting worse after she was pregnant.  She states that her swelling happens every day and is the worst at night.  It will cause her legs to feel heavy and achy.  She endorses a history of "superficial venous clots" for at least 5 to 10 years.  The patient states she does have a history of DVT/PE in July 2022.  She states at the time she was finishing x-ray tech school and noticed left calf swelling and pain.  She then noticed that the swelling and pain progressed to the bend of her knee.  She did not go to the emergency room until she started developing palpitations, shortness of breath, and a syncopal episode.  On 05/31/2021 she went to the emergency room and was diagnosed with acute bilateral PE and left infrapopliteal DVT.  She was then placed on Eliquis and has continued it since.  Prior to this incident she had no history of DVT/PE.  She has no history of cancer.  While in the hospital in July 2022, the patient had a right calf ulcer evaluated.  This was a venous ulcer measuring 1 x 1 cm.  She states at the time it was weeping.  She was told to clean the area with saline and put a bandage over it to promote healing.  She states that the ulcer healed months ago.  She has had no other ulcers.  Venous symptoms include: Aching and heavy legs.  Right leg venous ulcer in 2022. Onset/duration: Greater than 10 years Occupation: Publishing rights manager Aggravating factors: Standing, sitting Alleviating factors: Compression Compression: Knee-high compression socks  Helps: Yes Pain  medications: No Previous vein procedures: No History of DVT: Yes  She endorses a family history of varicose veins.  She states her grandmother had similar issues.  Past Medical History:  Diagnosis Date   History of DVT (deep vein thrombosis)    Hypokalemia 05/31/2021   Hypotension 05/31/2021   Lymphadenitis 08/11/2021    No past surgical history on file.  Social History   Socioeconomic History   Marital status: Single    Spouse name: Not on file   Number of children: Not on file   Years of education: Not on file   Highest education level: Not on file  Occupational History   Not on file  Tobacco Use   Smoking status: Never   Smokeless tobacco: Never  Substance and Sexual Activity   Alcohol use: No   Drug use: No   Sexual activity: Not on file  Other Topics Concern   Not on file  Social History Narrative   Not on file   Social Determinants of Health   Financial Resource Strain: Not on file  Food Insecurity: Not on file  Transportation Needs: Not on file  Physical Activity: Not on file  Stress: Not on file  Social Connections: Not on file  Intimate Partner Violence: Not on file    Family History  Problem Relation Age of Onset   Diabetes Mother    Heart attack Father 94   Heart  attack Paternal Grandfather     Current Outpatient Medications  Medication Sig Dispense Refill   acetaminophen (TYLENOL) 325 MG tablet Take 1 tablet (325 mg total) by mouth every 4 (four) hours as needed for headache or mild pain.     amLODipine (NORVASC) 10 MG tablet Take 1 tablet (10 mg total) by mouth daily. 30 tablet 11   apixaban (ELIQUIS) 5 MG TABS tablet Take 1 tablet (5 mg total) by mouth 2 (two) times daily. 60 tablet 3   carvedilol (COREG) 6.25 MG tablet Take 1 tablet (6.25 mg total) by mouth 2 (two) times daily with a meal. 180 tablet 2   losartan (COZAAR) 100 MG tablet Take 1 tablet (100 mg total) by mouth daily. 30 tablet 11   Multiple Vitamins-Minerals (THRIVE FOR LIFE  WOMENS) TABS Take 2 tablets by mouth daily. By Level     spironolactone (ALDACTONE) 25 MG tablet Take 1 tablet (25 mg total) by mouth daily. 30 tablet 11   No current facility-administered medications for this visit.    No Known Allergies  REVIEW OF SYSTEMS (negative unless checked):   Cardiac:  []  Chest pain or chest pressure? []  Shortness of breath upon activity? []  Shortness of breath when lying flat? []  Irregular heart rhythm?  Vascular:  [x]  Pain in calf, thigh, or hip brought on by walking? []  Pain in feet at night that wakes you up from your sleep? []  Blood clot in your veins? [x]  Leg swelling?  Pulmonary:  []  Oxygen at home? []  Productive cough? []  Wheezing?  Neurologic:  []  Sudden weakness in arms or legs? []  Sudden numbness in arms or legs? []  Sudden onset of difficult speaking or slurred speech? []  Temporary loss of vision in one eye? []  Problems with dizziness?  Gastrointestinal:  []  Blood in stool? []  Vomited blood?  Genitourinary:  []  Burning when urinating? []  Blood in urine?  Psychiatric:  []  Major depression  Hematologic:  []  Bleeding problems? []  Problems with blood clotting?  Dermatologic:  []  Rashes or ulcers?  Constitutional:  []  Fever or chills?  Ear/Nose/Throat:  []  Change in hearing? []  Nose bleeds? []  Sore throat?  Musculoskeletal:  []  Back pain? []  Joint pain? []  Muscle pain?   Physical Examination    There were no vitals filed for this visit. There is no height or weight on file to calculate BMI.  General:  WDWN in NAD; vital signs documented above Gait: Not observed HENT: WNL, normocephalic Pulmonary: normal non-labored breathing , without Rales, rhonchi,  wheezing Cardiac: regular HR, without murmurs, without carotid bruit Abdomen: soft, NT, no masses Skin: without rashes Vascular Exam/Pulses: 2+ DP pulses bilaterally Extremities: with varicose veins, with reticular veins, with edema, without stasis  pigmentation, without lipodermatosclerosis, with 1 healed right calf ulcer measuring 1 x 1 cm Musculoskeletal: no muscle wasting or atrophy  Neurologic: A&O X 3;  No focal weakness or paresthesias are detected Psychiatric:  The pt has Normal affect.  Non-invasive Vascular Imaging   Venous Insufficiency Duplex (05/29/2022):   LLE:  CFV                     yes   >1 second                       +--------------+---------+------+-----------+------------+--------+  FV prox       no                                              +--------------+---------+------+-----------+------------+--------+  FV mid        no                                              +--------------+---------+------+-----------+------------+--------+  FV dist       no                                              +--------------+---------+------+-----------+------------+--------+  Popliteal               yes   >1 second                       +--------------+---------+------+-----------+------------+--------+  GSV at [facility]              yes    >500 ms     0.903              +--------------+---------+------+-----------+------------+--------+  GSV prox thigh          yes    >500 ms     0.841              +--------------+---------+------+-----------+------------+--------+  GSV mid thigh           yes    >500 ms      1.15              +--------------+---------+------+-----------+------------+--------+  GSV dist thigh          yes    >500 ms      0.8               +--------------+---------+------+-----------+------------+--------+  GSV at knee             yes    >500 ms      1.17              +--------------+---------+------+-----------+------------+--------+  GSV prox calf           yes    >500 ms      0.67              +--------------+---------+------+-----------+------------+--------+  SSV Pop Fossa no                           0.523               +--------------+---------+------+-----------+------------+--------+  SSV prox calf           yes    >500 ms     0.359              +--------------+---------+------+-----------+------------+--------+  SSV mid calf            yes    >500 ms     0.328              +--------------+---------+------+-----------+------------+--------+      Summary:  Left:  - No evidence of deep vein thrombosis seen in the left lower extremity,  from the common femoral through the popliteal veins.  - No evidence of superficial venous thrombosis in the left lower  extremity.   DEEP  - Venous reflux is noted in the left common femoral vein.  - Venous reflux is noted in the left popliteal vein.   SUPERFICIAL  -  Venous reflux is noted in the left sapheno-femoral junction.  - Venous reflux is noted in the left greater saphenous vein in the thigh.  - Venous reflux is noted in the left greater saphenous vein in the calf.  - Venous reflux is noted in the left short saphenous vein.   Medical Decision Making   Thurma Priego is a 40 y.o. female who presents with: bilateral LE chronic venous insufficiency and bilateral varicose veins with history of left popliteal DVT in 05/2021.  Based off of the left lower extremity duplex study, the patient has venous reflux throughout her entire greater saphenous vein, including the SFJ.  Her greater saphenous vein is also greater than 4 mm throughout the entire thigh.  She also has reflux in the small saphenous vein, it is almost 4 mm in size. The patient has a history of DVT/PE in 05/2021 and is currently on Eliquis.  She has a long history of bilateral lower extremity edema and bilateral varicose veins for at least 10 years.  She also has has a history of thrombophlebitis.  She also has a history of right calf venous ulcer measuring 1x1cm in 2022, this has healed months ago. Other symptoms of venous disease in the lower legs includes aching and heavy legs,  aggravated by sitting and standing.  She gets some relief from knee-high compression socks she has been wearing for at least 5 years. I believe she is a candidate for venous ablation of the greater saphenous vein, potentially also the smaller saphenous vein. I discussed with the patient the use of her 20-30 mm thigh high compression stockings and need for 3 month trial of such. She will also elevate her legs and use Tylenol as needed for pain. The patient will follow up in 3 months with Dr. Edilia Bo or Dr.Cain. At that time she will also get a venous reflux study of her right lower extremity.   Loel Dubonnet, PA-C [facility] Office: [phone]  05/28/2022, 11:04 AM  Clinic MD: Edilia Bo

## 2022-05-29 ENCOUNTER — Ambulatory Visit (HOSPITAL_COMMUNITY)
Admission: RE | Admit: 2022-05-29 | Discharge: 2022-05-29 | Disposition: A | Discharge status: Home / Self Care | Payer: Medicaid Other | Source: Ambulatory Visit | Attending: Vascular Surgery | Admitting: Vascular Surgery

## 2022-05-29 DIAGNOSIS — I872 Venous insufficiency (chronic) (peripheral): Secondary | ICD-10-CM | POA: Insufficient documentation

## 2022-05-31 ENCOUNTER — Ambulatory Visit: Payer: Medicaid Other | Admitting: Physician Assistant

## 2022-05-31 VITALS — BP 160/97 | HR 70 | Temp 98.3°F | Resp 20 | Ht 65.0 in | Wt 209.7 lb

## 2022-05-31 DIAGNOSIS — I872 Venous insufficiency (chronic) (peripheral): Secondary | ICD-10-CM | POA: Diagnosis not present

## 2022-05-31 DIAGNOSIS — I8393 Asymptomatic varicose veins of bilateral lower extremities: Secondary | ICD-10-CM

## 2022-06-04 ENCOUNTER — Encounter (HOSPITAL_BASED_OUTPATIENT_CLINIC_OR_DEPARTMENT_OTHER): Payer: Self-pay | Admitting: Cardiology

## 2022-06-04 ENCOUNTER — Other Ambulatory Visit (HOSPITAL_COMMUNITY): Payer: Self-pay

## 2022-06-04 ENCOUNTER — Ambulatory Visit (INDEPENDENT_AMBULATORY_CARE_PROVIDER_SITE_OTHER): Payer: Medicaid Other | Admitting: Cardiology

## 2022-06-04 VITALS — BP 150/96 | HR 67 | Ht 65.0 in | Wt 208.7 lb

## 2022-06-04 DIAGNOSIS — Z712 Person consulting for explanation of examination or test findings: Secondary | ICD-10-CM

## 2022-06-04 DIAGNOSIS — Z79899 Other long term (current) drug therapy: Secondary | ICD-10-CM | POA: Diagnosis not present

## 2022-06-04 DIAGNOSIS — Z8249 Family history of ischemic heart disease and other diseases of the circulatory system: Secondary | ICD-10-CM

## 2022-06-04 DIAGNOSIS — R002 Palpitations: Secondary | ICD-10-CM | POA: Diagnosis not present

## 2022-06-04 DIAGNOSIS — I10 Essential (primary) hypertension: Secondary | ICD-10-CM

## 2022-06-04 DIAGNOSIS — Z86718 Personal history of other venous thrombosis and embolism: Secondary | ICD-10-CM

## 2022-06-04 DIAGNOSIS — Z7189 Other specified counseling: Secondary | ICD-10-CM

## 2022-06-04 MED ORDER — CHLORTHALIDONE 25 MG PO TABS
25.0000 mg | ORAL_TABLET | Freq: Every day | ORAL | 3 refills | Status: DC
  Filled 2022-06-04: qty 90, 90d supply, fill #0
  Filled 2022-08-14: qty 90, 90d supply, fill #1
  Filled 2022-09-16 – 2022-12-10 (×2): qty 90, 90d supply, fill #2
  Filled 2023-02-14 – 2023-03-08 (×2): qty 90, 90d supply, fill #3

## 2022-06-04 NOTE — Patient Instructions (Addendum)
Medication Instructions:  START: Chlorthalidone 25 mg daily   *If you need a refill on your cardiac medications before your next appointment, please call your pharmacy*   Lab Work: Your provider has recommended lab work in 3 weeks (BMP, Fasting Lipid). Please have this collected at [facility] [place]. The lab is open 8:00 am - 4:30 pm. Please avoid 12:00p - 1:00p for lunch hour. You do not need an appointment. Please go to [address] [place], [place] [address]. This is in the Primary Care office on the 3rd floor, let them know you are there for blood work and they will direct you to the lab.   If you have labs (blood work) drawn today and your tests are completely normal, you will receive your results only by: MyChart Message (if you have MyChart) OR A paper copy in the mail If you have any lab test that is abnormal or we need to change your treatment, we will call you to review the results.   Testing/Procedures: CT coronary calcium score.   Test locations:  [facility]   This is $99 out of pocket.   Coronary CalciumScan A coronary calcium scan is an imaging test used to look for deposits of calcium and other fatty materials (plaques) in the inner lining of the blood vessels of the heart (coronary arteries). These deposits of calcium and plaques can partly clog and narrow the coronary arteries without producing any symptoms or warning signs. This puts a person at risk for a heart attack. This test can detect these deposits before symptoms develop. Tell a health care provider about: Any allergies you have. All medicines you are taking, including vitamins, herbs, eye drops, creams, and over-the-counter medicines. Any problems you or family members have had with anesthetic medicines. Any blood disorders you have. Any surgeries you have had. Any medical conditions you have. Whether you are pregnant or may be  pregnant. What are the risks? Generally, this is a safe procedure. However, problems may occur, including: Harm to a pregnant woman and her unborn baby. This test involves the use of radiation. Radiation exposure can be dangerous to a pregnant woman and her unborn baby. If you are pregnant, you generally should not have this procedure done. Slight increase in the risk of cancer. This is because of the radiation involved in the test. What happens before the procedure? No preparation is needed for this procedure. What happens during the procedure? You will undress and remove any jewelry around your neck or chest. You will put on a hospital gown. Sticky electrodes will be placed on your chest. The electrodes will be connected to an electrocardiogram (ECG) machine to record a tracing of the electrical activity of your heart. A CT scanner will take pictures of your heart. During this time, you will be asked to lie still and hold your breath for 2-3 seconds while a picture of your heart is being taken. The procedure may vary among health care providers and hospitals. What happens after the procedure? You can get dressed. You can return to your normal activities. It is up to you to get the results of your test. Ask your health care provider, or the department that is doing the test, when your results will be ready. Summary A coronary calcium scan is an imaging test used to look for deposits of calcium and other fatty materials (plaques) in the inner lining of the blood vessels of the heart (coronary arteries).  Generally, this is a safe procedure. Tell your health care provider if you are pregnant or may be pregnant. No preparation is needed for this procedure. A CT scanner will take pictures of your heart. You can return to your normal activities after the scan is done. This information is not intended to replace advice given to you by your health care provider. Make sure you discuss any questions  you have with your health care provider. Document Released: 04/26/2008 Document Revised: 09/17/2016 Document Reviewed: 09/17/2016 Elsevier Interactive Patient Education  2017 [employer].    Follow-Up: At [facility], you and your health needs are our priority.  As part of our continuing mission to provide you with exceptional heart care, we have created designated Provider Care Teams.  These Care Teams include your primary Cardiologist (physician) and Advanced Practice Providers (APPs -  Physician Assistants and Nurse Practitioners) who all work together to provide you with the care you need, when you need it.  We recommend signing up for the patient portal called "MyChart".  Sign up information is provided on this After Visit Summary.  MyChart is used to connect with patients for Virtual Visits (Telemedicine).  Patients are able to view lab/test results, encounter notes, upcoming appointments, etc.  Non-urgent messages can be sent to your provider as well.   To learn more about what you can do with MyChart, go to [email or web address].    Your next appointment:   3 week(s)  The format for your next appointment:   In Person  Provider: Nurse visit (Blood pressure check)  Your physician recommends that you schedule a follow-up appointment in 3 months with Dr. Lake Bells   -how to check blood pressure:  -sit comfortably in a chair, feet uncrossed and flat on floor, for 5-10 minutes  -arm ideally should rest at the level of the heart. However, arm should be relaxed and not tense (for example, do not hold the arm up unsupported)  -avoid exercise, caffeine, and tobacco for at least 30 minutes prior to BP reading  -don't take BP cuff reading over clothes (always place on skin directly)  -I prefer to know how well the medication is working, so I would like you to take your readings 1-2 hours after taking your blood pressure medication if possible

## 2022-06-04 NOTE — Progress Notes (Signed)
Cardiology Office Note:    Date:  06/04/2022   ID:  West Bali, DOB 02-11-1981, MRN [id number]  PCP:  Rudene Christians, DO  Cardiologist:  Jodelle Red, MD  Referring MD: Rudene Christians, DO   CC: New patient evaluation for palpitations and monitor results   History of Present Illness:    Sabrina Mclaughlin is a 41 y.o. 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  She was seen by Dr. Ellison Carwin on 03/19/2022 regarding varicose veins of both lower extremities with pain; she was referred to vascular surgery. She also reported palpitations so a Holter monitor was completed. She remained on Eliquis given her history of a high risk pulmonary embolism. Her blood pressure was elevated at 145/82 and she was asked to record her blood pressures.  She saw Loel Dubonnet, PA-C on 05/31/2022. She was advised to try a 3 month trial of compression stockings for her varicose veins. She was thought to be a candidate for venous ablation of the greater saphenous vein, potentially also the smaller saphenous vein.  Tachycardia/palpitations: -Initial onset: Over 1 year ago. At the time she was in college, working, and a single mom. Her oldest daughter also moved in with her. Lately things are fine at home but her palpitations persist. -Frequency/Duration: Daily, about 3-4 times. -Associated symptoms: Flutter, then a hard beat. -Aggravating/alleviating factors: None. -Syncope/near syncope: None. -Prior cardiac history: On 05/31/21 she was diagnosed in the ER with acute bilateral PE and left infrapopliteal DVT. Other issues with blood clots over the past 10 years. She endorses superficial blood clots that resolved on their own. She confirms that she will be on life-long anticoagulation. -Prior workup: Holter monitor -Patient had a min HR of 46 bpm, max HR  of 167 bpm, and avg HR of 77 bpm. Predominant underlying rhythm was Sinus Rhythm. No VT, SVT, atrial fibrillation, high degree block, or pauses noted. Isolated atrial and ventricular ectopy was rare (<1%). There were 5 patient triggered events, which were sinus.  -Caffeine: She drinks at least one cup of coffee a day, maybe two. She has cut out a lot of soda. -Alcohol: Wine cooler, maybe every few weeks. -Tobacco: None -OTC supplements: None -Comorbidities: She knows that her blood pressure is high, but does not monitor at home regularly. Lately she states she has lost about 20 lbs, but did not notice any improvement in her blood pressures. Of note, she endorses some swelling secondary to amlodipine. She takes most of her antihypertensives at lunch time, and second doses in the evenings. 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 Also she is "extremely" active both at work and at home. -Labs: TSH, kidney function/electrolytes, CBC reviewed. -Cardiac ROS: no chest pain, no shortness of breath, no PND, no orthopnea, +LE edema (secondary to amlodipine). -Family history:  Her father died of a massive heart attack at 43 yo. Her paternal grandfather had CABG x3. Maternal grandparents died of cancer.   Past Medical History:  Diagnosis Date   History of DVT (deep vein thrombosis)    Hypokalemia 05/31/2021   Hypotension 05/31/2021   Lymphadenitis 08/11/2021    History reviewed. No pertinent surgical history.  Current Medications: Current Outpatient Medications on File Prior to Visit  Medication Sig   acetaminophen (TYLENOL) 325 MG tablet Take 1 tablet (325 mg  total) by mouth every 4 (four) hours as needed for headache or mild pain.   amLODipine (NORVASC) 10 MG tablet Take 1 tablet (10 mg total) by mouth daily.   apixaban (ELIQUIS) 5 MG TABS tablet Take 1 tablet (5 mg total) by mouth 2 (two) times daily.   carvedilol (COREG) 6.25  MG tablet Take 1 tablet (6.25 mg total) by mouth 2 (two) times daily with a meal.   losartan (COZAAR) 100 MG tablet Take 1 tablet (100 mg total) by mouth daily.   Multiple Vitamins-Minerals (THRIVE FOR LIFE WOMENS) TABS Take 2 tablets by mouth daily. By Level   spironolactone (ALDACTONE) 25 MG tablet Take 1 tablet (25 mg total) by mouth daily.   No current facility-administered medications on file prior to visit.     Allergies:   Patient has no known allergies.   Social History   Tobacco Use   Smoking status: Never    Passive exposure: Never   Smokeless tobacco: Never  Substance Use Topics   Alcohol use: No   Drug use: No    Family History: family history includes Diabetes in her mother; Heart attack in her paternal grandfather; Heart attack (age of onset: 13) in her father.  ROS:   Please see the history of present illness.  Additional pertinent ROS: Constitutional: Negative for chills, fever, night sweats, unintentional weight loss  HENT: Negative for ear pain and hearing loss.   Eyes: Negative for loss of vision and eye pain.  Respiratory: Negative for cough, sputum, wheezing.   Cardiovascular: See HPI. Gastrointestinal: Negative for abdominal pain, melena, and hematochezia.  Genitourinary: Negative for dysuria and hematuria.  Musculoskeletal: Negative for falls and myalgias.  Skin: Negative for itching and rash.  Neurological: Negative for focal weakness, focal sensory changes and loss of consciousness.  Endo/Heme/Allergies: Does not bruise/bleed easily.     EKGs/Labs/Other Studies Reviewed:    The following studies were reviewed today:  LE Venous Doppler  05/29/2022: Summary:  Left:  - No evidence of deep vein thrombosis seen in the left lower extremity,  from the common femoral through the popliteal veins.  - No evidence of superficial venous thrombosis in the left lower  extremity.   DEEP  - Venous reflux is noted in the left common femoral vein.  - Venous  reflux is noted in the left popliteal vein.   SUPERFICIAL  - Venous reflux is noted in the left sapheno-femoral junction.  - Venous reflux is noted in the left greater saphenous vein in the thigh.  - Venous reflux is noted in the left greater saphenous vein in the calf.  - Venous reflux is noted in the left short saphenous vein.   Monitor  05/2022: 30 days of data recorded on [employer] monitor. Patient had a min HR of 46 bpm, max HR of 167 bpm, and avg HR of 77 bpm. Predominant underlying rhythm was Sinus Rhythm. No VT, SVT, atrial fibrillation, high degree block, or pauses noted. Isolated atrial and ventricular ectopy was rare (<1%). There were 5 patient triggered events, which were sinus. No significant arrhythmias detected.   Echocardiogram  06/01/2021:  1. Right ventricular systolic function is moderately reduced. The right  ventricular size is moderately enlarged. There is normal pulmonary artery  systolic pressure. The estimated right ventricular systolic pressure is  35.4 mmHg.There is the  interventricular septum is flattened in systole and diastole, consistent  with right ventricular pressure and volume overload.   2. The inferior vena cava is dilated in  size with <50% respiratory  variability, suggesting right atrial pressure of 15 mmHg.   3. Left ventricular ejection fraction, by estimation, is 60%. The left  ventricle has normal function. The left ventricle has no regional wall  motion abnormalities. Indeterminate diastolic filling due to E-A fusion.   4. The mitral valve is normal in structure. No evidence of mitral valve  regurgitation. No evidence of mitral stenosis.   5. The aortic valve is tricuspid. Aortic valve regurgitation is not  visualized. No aortic stenosis is present.   Comparison(s): No prior Echocardiogram.   Conclusion(s)/Recommendation(s): Given history of pulmonary embolism,  findings are consistent with acute right ventricular strain; clinical   correlation advised. Reaching out to primary team.   CTA Chest  05/31/2021: FINDINGS: Cardiovascular: Satisfactory opacification of the pulmonary arteries to the segmental level. Extensive bilateral acute pulmonary emboli, involves the distal main pulmonary arteries bilaterally. Extension of thrombus into right upper lobe segmental vessels, inter lobar pulmonary artery, right middle lobe segmental vessels and right lower lobe segmental and subsegmental vessels. Thrombus on the left extends into left upper and lower lobe segmental and subsegmental vessels. Positive for right heart strain with RV LV ratio of 2. Left ventricular walls appear thickened. Cardiac size is normal. No pericardial effusion.   Mediastinum/Nodes: Midline trachea. 4.3 cm heterogenous mass at the thyroid isthmus and right lobe. No suspicious adenopathy. Esophagus within normal limits   Lungs/Pleura: Lungs are clear. No pleural effusion or pneumothorax.   Upper Abdomen: No acute abnormality.   Musculoskeletal: No chest wall abnormality. No acute or significant osseous findings.   Review of the MIP images confirms the above findings.   IMPRESSION: Positive for acute bilateral PE with CT evidence of right heart strain (RV/LV Ratio = 2.0) consistent with at least submassive (intermediate risk) PE. The presence of right heart strain has been associated with an increased risk of morbidity and mortality. Please refer to the "PE Focused" order set in EPIC.   4.3 cm heterogenous thyroid mass. Recommend thyroid US (ref: J Am Coll Radiol. 2015 Feb;12(2): 143-50).   EKG:  EKG is personally reviewed.   06/04/2022: NSR at 67 bpm  Recent Labs: 06/09/2021: BUN 13; Creatinine, Ser 0.71; Potassium 4.8; Sodium 137   Recent Lipid Panel No results found for: "CHOL", "TRIG", "HDL", "CHOLHDL", "VLDL", "LDLCALC", "LDLDIRECT"  Physical Exam:    VS:  BP (!) 150/96 (BP Location: Right Arm, Patient Position: Sitting, Cuff  Size: Large)   Pulse 67   Ht 5\' 5"  (1.651 m)   Wt 208 lb 11.2 oz (94.7 kg)   LMP 05/08/2022   BMI 34.73 kg/m     Wt Readings from Last 3 Encounters:  06/04/22 208 lb 11.2 oz (94.7 kg)  05/31/22 209 lb 11.2 oz (95.1 kg)  03/19/22 197 lb 11.2 oz (89.7 kg)    GEN: Well nourished, well developed in no acute distress HEENT: Normal, moist mucous membranes NECK: No JVD CARDIAC: regular rhythm, normal S1 and S2, no rubs or gallops. No murmur. VASCULAR: Radial and DP pulses 2+ bilaterally. No carotid bruits RESPIRATORY:  Clear to auscultation without rales, wheezing or rhonchi  ABDOMEN: Soft, non-tender, non-distended MUSCULOSKELETAL:  Ambulates independently SKIN: Warm and dry, Trace bilateral LE edema with compression socks in place. NEUROLOGIC:  Alert and oriented x 3. No focal neuro deficits noted. PSYCHIATRIC:  Normal affect    ASSESSMENT:    1. Heart palpitations   2. Essential hypertension   3. Family history of heart disease   4.  Cardiac risk counseling   5. Counseling on health promotion and disease prevention   6. History of DVT (deep vein thrombosis)   7. Medication management   8. Encounter to discuss test results    PLAN:    Palpitations -we reviewed her monitor results together today. No significant arrhythmias. Likely sensation is noncardiac  Hypertension -elevated today -discussed options. Already on amlodipine, losartan, spironolactone -with LE edema, will add chlorthalidone -recheck BMET after starting  History of DVT -minimal swelling today, wears compression stockings  Family history of heart disease -we reviewed the pros/cons of calcium scores. A cardiac CT scan for coronary calcium is a non-invasive way of obtaining information about the presence, location and extent of calcified plaque in the coronary arteries--the vessels that supply oxygen-containing blood to the heart muscle. Calcified plaque results when there is a build-up of fat and other  substances under the inner layer of the artery. This material can calcify which signals the presence of atherosclerosis, a disease of the vessel wall, also called coronary artery disease.  People with this disease have an increased risk for heart attacks. In addition, over time, progression of plaque build up (CAD) can narrow the arteries or even close off blood flow to the heart. Because calcium is a marker of CAD, the amount of calcium detected on a cardiac CT scan is a helpful prognostic tool.  -we reviewed the charts together which show the relationship between calcium score and 15 year all cause mortality -after shared decision making, will proceed with coronary calcium score. They understand this is an out of pocket/self pay test currently costing $99. -if calcium score nonzero, we did preliminarily discuss statin and the data for benefit  -check lipids  Cardiac risk counseling and prevention recommendations: -recommend heart healthy/Mediterranean diet, with whole grains, fruits, vegetable, fish, lean meats, nuts, and olive oil. Limit salt. -recommend moderate walking, 3-5 times/week for 30-50 minutes each session. Aim for at least 150 minutes.week. Goal should be pace of 3 miles/hours, or walking 1.5 miles in 30 minutes -recommend avoidance of tobacco products. Avoid excess alcohol.  Plan for follow up: 3 months or sooner as needed.  Jodelle Red, MD, PhD, [facility]  [facility]    Medication Adjustments/Labs and Tests Ordered: Current medicines are reviewed at length with the patient today.  Concerns regarding medicines are outlined above.   Orders Placed This Encounter  Procedures   CT CARDIAC SCORING (SELF PAY ONLY)   Lipid panel   Basic metabolic panel   EKG 12-Lead   Meds ordered this encounter  Medications   chlorthalidone (HYGROTON) 25 MG tablet    Sig: Take 1 tablet (25 mg total) by mouth daily.    Dispense:  90 tablet    Refill:  3   Patient  Instructions  Medication Instructions:  START: Chlorthalidone 25 mg daily   *If you need a refill on your cardiac medications before your next appointment, please call your pharmacy*   Lab Work: Your provider has recommended lab work in 3 weeks (BMP, Fasting Lipid). Please have this collected at [facility]. The lab is open 8:00 am - 4:30 pm. Please avoid 12:00p - 1:00p for lunch hour. You do not need an appointment. Please go to [address] [place], [place] [address]. This is in the Primary Care office on the 3rd floor, let them know you are there for blood work and they will direct you to the lab.   If you  have labs (blood work) drawn today and your tests are completely normal, you will receive your results only by: MyChart Message (if you have MyChart) OR A paper copy in the mail If you have any lab test that is abnormal or we need to change your treatment, we will call you to review the results.   Testing/Procedures: CT coronary calcium score.   Test locations:  [facility]   This is $99 out of pocket.   Coronary CalciumScan A coronary calcium scan is an imaging test used to look for deposits of calcium and other fatty materials (plaques) in the inner lining of the blood vessels of the heart (coronary arteries). These deposits of calcium and plaques can partly clog and narrow the coronary arteries without producing any symptoms or warning signs. This puts a person at risk for a heart attack. This test can detect these deposits before symptoms develop. Tell a health care provider about: Any allergies you have. All medicines you are taking, including vitamins, herbs, eye drops, creams, and over-the-counter medicines. Any problems you or family members have had with anesthetic medicines. Any blood disorders you have. Any surgeries you have had. Any medical conditions you have. Whether you are pregnant or may be  pregnant. What are the risks? Generally, this is a safe procedure. However, problems may occur, including: Harm to a pregnant woman and her unborn baby. This test involves the use of radiation. Radiation exposure can be dangerous to a pregnant woman and her unborn baby. If you are pregnant, you generally should not have this procedure done. Slight increase in the risk of cancer. This is because of the radiation involved in the test. What happens before the procedure? No preparation is needed for this procedure. What happens during the procedure? You will undress and remove any jewelry around your neck or chest. You will put on a hospital gown. Sticky electrodes will be placed on your chest. The electrodes will be connected to an electrocardiogram (ECG) machine to record a tracing of the electrical activity of your heart. A CT scanner will take pictures of your heart. During this time, you will be asked to lie still and hold your breath for 2-3 seconds while a picture of your heart is being taken. The procedure may vary among health care providers and hospitals. What happens after the procedure? You can get dressed. You can return to your normal activities. It is up to you to get the results of your test. Ask your health care provider, or the department that is doing the test, when your results will be ready. Summary A coronary calcium scan is an imaging test used to look for deposits of calcium and other fatty materials (plaques) in the inner lining of the blood vessels of the heart (coronary arteries). Generally, this is a safe procedure. Tell your health care provider if you are pregnant or may be pregnant. No preparation is needed for this procedure. A CT scanner will take pictures of your heart. You can return to your normal activities after the scan is done. This information is not intended to replace advice given to you by your health care provider. Make sure you discuss any questions  you have with your health care provider. Document Released: 04/26/2008 Document Revised: 09/17/2016 Document Reviewed: 09/17/2016 Elsevier Interactive Patient Education  2017 [employer].    Follow-Up: At [facility], you and your health needs are our priority.  As part of our continuing mission to provide you  with exceptional heart care, we have created designated Provider Care Teams.  These Care Teams include your primary Cardiologist (physician) and Advanced Practice Providers (APPs -  Physician Assistants and Nurse Practitioners) who all work together to provide you with the care you need, when you need it.  We recommend signing up for the patient portal called "MyChart".  Sign up information is provided on this After Visit Summary.  MyChart is used to connect with patients for Virtual Visits (Telemedicine).  Patients are able to view lab/test results, encounter notes, upcoming appointments, etc.  Non-urgent messages can be sent to your provider as well.   To learn more about what you can do with MyChart, go to [email or web address].    Your next appointment:   3 week(s)  The format for your next appointment:   In Person  Provider: Nurse visit (Blood pressure check)  Your physician recommends that you schedule a follow-up appointment in 3 months with Dr. Lake Bellshristophe   -how to check blood pressure:  -sit comfortably in a chair, feet uncrossed and flat on floor, for 5-10 minutes  -arm ideally should rest at the level of the heart. However, arm should be relaxed and not tense (for example, do not hold the arm up unsupported)  -avoid exercise, caffeine, and tobacco for at least 30 minutes prior to BP reading  -don't take BP cuff reading over clothes (always place on skin directly)  -I prefer to know how well the medication is working, so I would like you to take your readings 1-2 hours after taking your blood pressure medication if possible    I,Mathew Stumpf,acting as a scribe  for [employer]ridgette Autumnrose Yore, MD.,have documented all relevant documentation on the behalf of Jodelle RedBridgette Dakiya Puopolo, MD,as directed by  Jodelle RedBridgette Tome Wilson, MD while in the presence of Jodelle RedBridgette Keeghan Bialy, MD.  I, Jodelle RedBridgette Jaydi Bray, MD, have reviewed all documentation for this visit. The documentation on 07/08/22 for the exam, diagnosis, procedures, and orders are all accurate and complete.   Signed, Jodelle RedBridgette Elysabeth Aust, MD PhD 06/04/2022     [facility]

## 2022-06-06 ENCOUNTER — Other Ambulatory Visit: Payer: Self-pay

## 2022-06-06 DIAGNOSIS — I872 Venous insufficiency (chronic) (peripheral): Secondary | ICD-10-CM

## 2022-06-25 ENCOUNTER — Other Ambulatory Visit (HOSPITAL_COMMUNITY): Payer: Self-pay

## 2022-06-29 ENCOUNTER — Ambulatory Visit (HOSPITAL_BASED_OUTPATIENT_CLINIC_OR_DEPARTMENT_OTHER): Payer: Medicaid Other

## 2022-06-29 ENCOUNTER — Ambulatory Visit (HOSPITAL_BASED_OUTPATIENT_CLINIC_OR_DEPARTMENT_OTHER)
Admission: RE | Admit: 2022-06-29 | Discharge: 2022-06-29 | Disposition: A | Discharge status: Home / Self Care | Payer: Medicaid Other | Source: Ambulatory Visit | Attending: Cardiology | Admitting: Cardiology

## 2022-06-29 DIAGNOSIS — Z8249 Family history of ischemic heart disease and other diseases of the circulatory system: Secondary | ICD-10-CM | POA: Insufficient documentation

## 2022-07-04 ENCOUNTER — Other Ambulatory Visit: Payer: Self-pay | Admitting: Internal Medicine

## 2022-07-04 DIAGNOSIS — I159 Secondary hypertension, unspecified: Secondary | ICD-10-CM

## 2022-07-04 DIAGNOSIS — I2609 Other pulmonary embolism with acute cor pulmonale: Secondary | ICD-10-CM

## 2022-07-05 ENCOUNTER — Other Ambulatory Visit (HOSPITAL_COMMUNITY): Payer: Self-pay

## 2022-07-05 MED ORDER — APIXABAN 5 MG PO TABS
5.0000 mg | ORAL_TABLET | Freq: Two times a day (BID) | ORAL | 3 refills | Status: DC
  Filled 2022-07-05: qty 60, 30d supply, fill #0
  Filled 2022-08-14: qty 60, 30d supply, fill #1
  Filled 2022-09-16: qty 60, 30d supply, fill #2
  Filled 2022-10-15: qty 60, 30d supply, fill #3

## 2022-07-06 ENCOUNTER — Other Ambulatory Visit (HOSPITAL_COMMUNITY): Payer: Self-pay

## 2022-07-10 ENCOUNTER — Encounter: Payer: Self-pay | Admitting: *Deleted

## 2022-07-19 ENCOUNTER — Other Ambulatory Visit: Payer: Self-pay | Admitting: Internal Medicine

## 2022-07-19 DIAGNOSIS — I159 Secondary hypertension, unspecified: Secondary | ICD-10-CM

## 2022-07-19 NOTE — Telephone Encounter (Signed)
Next appt scheduled tomorrow w/Dr Masters.

## 2022-07-20 ENCOUNTER — Other Ambulatory Visit (HOSPITAL_COMMUNITY): Payer: Self-pay

## 2022-07-20 ENCOUNTER — Ambulatory Visit: Payer: Medicaid Other | Admitting: Internal Medicine

## 2022-07-20 ENCOUNTER — Encounter: Payer: Self-pay | Admitting: Internal Medicine

## 2022-07-20 VITALS — BP 118/93 | HR 79 | Temp 98.2°F | Ht 65.0 in | Wt 211.7 lb

## 2022-07-20 DIAGNOSIS — Z86711 Personal history of pulmonary embolism: Secondary | ICD-10-CM | POA: Diagnosis not present

## 2022-07-20 DIAGNOSIS — R7303 Prediabetes: Secondary | ICD-10-CM

## 2022-07-20 DIAGNOSIS — I872 Venous insufficiency (chronic) (peripheral): Secondary | ICD-10-CM

## 2022-07-20 DIAGNOSIS — I159 Secondary hypertension, unspecified: Secondary | ICD-10-CM | POA: Diagnosis not present

## 2022-07-20 DIAGNOSIS — Z8249 Family history of ischemic heart disease and other diseases of the circulatory system: Secondary | ICD-10-CM | POA: Diagnosis not present

## 2022-07-20 DIAGNOSIS — Z0000 Encounter for general adult medical examination without abnormal findings: Secondary | ICD-10-CM

## 2022-07-20 DIAGNOSIS — E669 Obesity, unspecified: Secondary | ICD-10-CM | POA: Diagnosis not present

## 2022-07-20 DIAGNOSIS — Z1159 Encounter for screening for other viral diseases: Secondary | ICD-10-CM | POA: Diagnosis not present

## 2022-07-20 DIAGNOSIS — R002 Palpitations: Secondary | ICD-10-CM

## 2022-07-20 MED ORDER — SPIRONOLACTONE 25 MG PO TABS
25.0000 mg | ORAL_TABLET | Freq: Every day | ORAL | 11 refills | Status: DC
  Filled 2022-07-20: qty 30, 30d supply, fill #0
  Filled 2022-08-14: qty 30, 30d supply, fill #1
  Filled 2022-09-16: qty 30, 30d supply, fill #2
  Filled 2022-10-15: qty 30, 30d supply, fill #3
  Filled 2022-11-12: qty 30, 30d supply, fill #4
  Filled 2022-12-10: qty 30, 30d supply, fill #5
  Filled 2023-01-17: qty 30, 30d supply, fill #6
  Filled 2023-02-11: qty 30, 30d supply, fill #7
  Filled 2023-03-11: qty 30, 30d supply, fill #8
  Filled 2023-04-12: qty 30, 30d supply, fill #9
  Filled 2023-05-13: qty 30, 30d supply, fill #10
  Filled 2023-06-13: qty 30, 30d supply, fill #11

## 2022-07-20 MED ORDER — AMLODIPINE BESYLATE 10 MG PO TABS
10.0000 mg | ORAL_TABLET | Freq: Every day | ORAL | 11 refills | Status: DC
Start: 2022-07-20 — End: 2023-07-08
  Filled 2022-07-20: qty 30, 30d supply, fill #0
  Filled 2022-08-14: qty 30, 30d supply, fill #1
  Filled 2022-09-16: qty 30, 30d supply, fill #2
  Filled 2022-10-15: qty 30, 30d supply, fill #3
  Filled 2022-11-12: qty 30, 30d supply, fill #4
  Filled 2022-12-10: qty 30, 30d supply, fill #5
  Filled 2023-01-17: qty 30, 30d supply, fill #6
  Filled 2023-02-11: qty 30, 30d supply, fill #7
  Filled 2023-03-11: qty 30, 30d supply, fill #8
  Filled 2023-04-12: qty 30, 30d supply, fill #9
  Filled 2023-05-13: qty 30, 30d supply, fill #10
  Filled 2023-06-13: qty 30, 30d supply, fill #11

## 2022-07-20 MED ORDER — LOSARTAN POTASSIUM 100 MG PO TABS
100.0000 mg | ORAL_TABLET | Freq: Every day | ORAL | 11 refills | Status: DC
  Filled 2022-07-20: qty 30, 30d supply, fill #0
  Filled 2022-08-14: qty 30, 30d supply, fill #1
  Filled 2022-09-16: qty 30, 30d supply, fill #2
  Filled 2022-10-15: qty 30, 30d supply, fill #3
  Filled 2022-11-12: qty 30, 30d supply, fill #4
  Filled 2022-12-10: qty 30, 30d supply, fill #5
  Filled 2023-01-17: qty 30, 30d supply, fill #6
  Filled 2023-02-11: qty 30, 30d supply, fill #7
  Filled 2023-03-11: qty 30, 30d supply, fill #8
  Filled 2023-04-12: qty 30, 30d supply, fill #9
  Filled 2023-05-13: qty 30, 30d supply, fill #10
  Filled 2023-06-13: qty 30, 30d supply, fill #11

## 2022-07-20 NOTE — Assessment & Plan Note (Signed)
Her father passed away at 56 due to MI. She established with cardiology in 7/23 and cardiac calcium score completed and was 0. A/P: Consider lipid panel at follow-up visit.

## 2022-07-20 NOTE — Assessment & Plan Note (Signed)
Screening for Hepatitis C completed today.

## 2022-07-20 NOTE — Assessment & Plan Note (Signed)
She was evaluated by vascular surgery in July. Their recommendations were to wear compression stockings for 3 months and then to have ablation of greater saphenous vein. She is not currently having pain in her legs from varicose veins and asked questioned if she should still have ablation done. A/P: Continue compression hose. I talked with her about ablation being a way to help with pain in her legs. With her not having much pain currently she is leaning more towards not having ablation done.

## 2022-07-20 NOTE — Assessment & Plan Note (Signed)
Last hgbA1c at 5.8 over 1 year ago.  P: Repeat A1c

## 2022-07-20 NOTE — Assessment & Plan Note (Addendum)
Patient presents for follow-up on blood pressure. Current medications include carvedilol 6.25 BID, Amlodipine 10 mg, losartan 100 mg, chlorthalidone 25 mg, spironolactone 25mg . She was seen by cardiology in 7/23 and chlorthalidone was added at that time. She takes 4 medications in the morning and takes chlorthalidone with 2nd dose of carvedilol in the evening. She denies dizziness since starting 5th agent. She has a blood pressure cuff at home but does not check blood pressure. She is able to tell when blood pressure is high due to increased pressure feeling in her head. A: Renin/aldosterone level were wnl in hospital 7/22. STOP-Bang score low at 2. She does not have clinical stigmata concerning for cushing syndrome. P: Continue carvedilol 6.25 BID, Amlodipine 10 mg, losartan 100 mg, chlorthalidone 25 mg, spironolactone 25 mg. -repeat BMP -renal ultrasound for renal artery stenosis

## 2022-07-20 NOTE — Assessment & Plan Note (Signed)
She is adherent with eliquis medication. Medication is affordable with medicaid.

## 2022-07-20 NOTE — Progress Notes (Signed)
Subjective:  CC: follow-up for hypertension  HPI:  Sabrina Mclaughlin is a 41 y.o. female with a past medical history stated below and presents today for follow-up on hypertension, chronic venous insufficiency, and obesity. Please see problem based assessment and plan for additional details.  Past Medical History:  Diagnosis Date   History of DVT (deep vein thrombosis)    Hypokalemia 05/31/2021   Hypotension 05/31/2021   Lymphadenitis 08/11/2021    Current Outpatient Medications on File Prior to Visit  Medication Sig Dispense Refill   acetaminophen (TYLENOL) 325 MG tablet Take 1 tablet (325 mg total) by mouth every 4 (four) hours as needed for headache or mild pain.     amLODipine (NORVASC) 10 MG tablet Take 1 tablet (10 mg total) by mouth daily. 30 tablet 11   apixaban (ELIQUIS) 5 MG TABS tablet Take 1 tablet by mouth 2 times daily. 60 tablet 3   carvedilol (COREG) 6.25 MG tablet Take 1 tablet (6.25 mg total) by mouth 2 (two) times daily with a meal. 180 tablet 2   chlorthalidone (HYGROTON) 25 MG tablet Take 1 tablet (25 mg total) by mouth daily. 90 tablet 3   losartan (COZAAR) 100 MG tablet Take 1 tablet (100 mg total) by mouth daily. 30 tablet 11   Multiple Vitamins-Minerals (THRIVE FOR LIFE WOMENS) TABS Take 2 tablets by mouth daily. By Level     spironolactone (ALDACTONE) 25 MG tablet Take 1 tablet (25 mg total) by mouth daily. 30 tablet 11   No current facility-administered medications on file prior to visit.    Family History  Problem Relation Age of Onset   Diabetes Mother    Heart attack Father 31   Heart attack Paternal Grandfather     Social History   Socioeconomic History   Marital status: Single    Spouse name: Not on file   Number of children: Not on file   Years of education: Not on file   Highest education level: Not on file  Occupational History   Not on file  Tobacco Use   Smoking status: Never    Passive exposure: Never   Smokeless tobacco: Never   Substance and Sexual Activity   Alcohol use: No   Drug use: No   Sexual activity: Not on file  Other Topics Concern   Not on file  Social History Narrative   Not on file   Social Determinants of Health   Financial Resource Strain: Low Risk  (06/04/2022)   Overall Financial Resource Strain (CARDIA)    Difficulty of Paying Living Expenses: Not hard at all  Food Insecurity: No Food Insecurity (06/04/2022)   Hunger Vital Sign    Worried About Running Out of Food in the Last Year: Never true    Ran Out of Food in the Last Year: Never true  Transportation Needs: No Transportation Needs (06/04/2022)   PRAPARE - Administrator, Civil Service (Medical): No    Lack of Transportation (Non-Medical): No  Physical Activity: Inactive (06/04/2022)   Exercise Vital Sign    Days of Exercise per Week: 0 days    Minutes of Exercise per Session: 0 min  Stress: Not on file  Social Connections: Not on file  Intimate Partner Violence: Not on file    Review of Systems: ROS negative except for what is noted on the assessment and plan.  Objective:   Vitals:   07/20/22 0916 07/20/22 0940  BP: (!) 121/92 (!) 118/93  Pulse: 87  79  Temp: 98.2 F (36.8 C)   TempSrc: Oral   SpO2: 100%   Weight: 211 lb 11.2 oz (96 kg)   Height: 5\' 5"  (1.651 m)     Physical Exam: Constitutional: well-appearing, in no acute distress Neck: no dorsal fat pad Cardiovascular: regular rate and rhythm, no m/r/g Pulmonary/Chest: normal work of breathing on room air, lungs clear to auscultation bilaterally Abdominal: soft, non-tender, non-distended MSK: 1+ non pitting edema to lower extremities bilaterally, ulcer on right calf is well-healed Neurological: alert & oriented x 3 Skin: warm and dry Psych: normal mood and affect     Assessment & Plan:  Hypertension Patient presents for follow-up on blood pressure. Current medications include carvedilol 6.25 BID, Amlodipine 10 mg, losartan 100 mg, chlorthalidone  25 mg, spironolactone 25mg . She was seen by cardiology in 7/23 and chlorthalidone was added at that time. She takes 4 medications in the morning and takes chlorthalidone with 2nd dose of carvedilol in the evening. She denies dizziness since starting 5th agent. She has a blood pressure cuff at home but does not check blood pressure. She is able to tell when blood pressure is high due to increased pressure feeling in her head. A: Renin/aldosterone level were wnl in hospital 7/22. STOP-Bang score low at 2. She does not have clinical stigmata concerning for cushing syndrome. P: Continue carvedilol 6.25 BID, Amlodipine 10 mg, losartan 100 mg, chlorthalidone 25 mg, spironolactone 25 mg. -repeat BMP -renal ultrasound for renal artery stenosis  History of pulmonary embolism She is adherent with eliquis medication. Medication is affordable with medicaid.  Venous insufficiency of both lower extremities She was evaluated by vascular surgery in July. Their recommendations were to wear compression stockings for 3 months and then to have ablation of greater saphenous vein. She is not currently having pain in her legs from varicose veins and asked questioned if she should still have ablation done. A/P: Continue compression hose. I talked with her about ablation being a way to help with pain in her legs. With her not having much pain currently she is leaning more towards not having ablation done.  Pre-diabetes Last hgbA1c at 5.8 over 1 year ago.  P: Repeat A1c  Obesity (BMI 35.0-39.9 without comorbidity) Patient would like to lose weight. Current BMI is at 35. She has a significant history of heart disease in her family. Her weight is stable from 1 year ago. She states that she knows that dietary changes that she would like to make, but hasn't acted on them yet. She works at [employer] as a [employer]12[employer] and is being trained in [place] currently. She has a goal to go to the gym at the hospital more  often. A/P: She is not currently interested in PREP program or referral to dietician. Follow-up in 3 months  Healthcare maintenance Screening for Hepatitis C completed today.  Palpitations She followed up with cardiology in 7/23. Holter monitor was reviewed at that time. There were no VT, SVT, atrial fibrillation, high degree block, or pauses noted. The palpation sensation was thought to be noncardiac. A/P: She continues to have sensation on occasion. Mostly when seated or laying down. Does not appear to correspond with eating and she denies acid taste in mouth, or burning.  Family history of cardiac disorder in father Her father passed away at 71 due to MI. She established with cardiology in 7/23 and cardiac calcium score completed and was 0. A/P: Consider lipid panel at follow-up visit.   Patient discussed with  Dr. Precious Bard Brayla Pat, D.O. [facility]  PGY-2 Pager: [phone]  Phone: [phone] Date 07/20/2022  Time 11:47 AM

## 2022-07-20 NOTE — Patient Instructions (Signed)
Thank you, Sabrina Mclaughlin for allowing [name] to provide your care today.  High blood pressure Please continue taking medications as you have been. I am rechecking some blood work with the medications you are on. I also ordered an ultrasound of your kidneys. Some time if blood supply is low to them your blood will think you need a higher blood pressure.  Varicose veins I think that the ablation Mclaughlin help with pain symptoms if you are having that. I am glad to hear that compression stockings are helping!  Obesity/ pre-diabetes I am rechecking blood work for this. Will call with results.  I have ordered the following labs for you:   Lab Orders         BMP8+Anion Gap         Hepatitis C antibody, reflex         POC Hbg A1C       Referrals ordered today:   Referral Orders  No referral(s) requested today     I have ordered the following medication/changed the following medications:   Stop the following medications: There are no discontinued medications.   Start the following medications: No orders of the defined types were placed in this encounter.    Follow up: 3 months   We look forward to seeing you next time. Please call our clinic at [phone] if you have any questions or concerns. The best time to call is Monday-Friday from 9am-4pm, but there is someone available 24/7. If after hours or the weekend, call the main hospital number and ask for the Internal Medicine Resident On-Call. If you need medication refills, please notify your pharmacy one week in advance and they will send [name] a request.   Thank you for trusting me with your care. Wishing you the best!   Rudene Christians, DO [facility]

## 2022-07-20 NOTE — Assessment & Plan Note (Signed)
She followed up with cardiology in 7/23. Holter monitor was reviewed at that time. There were no VT, SVT, atrial fibrillation, high degree block, or pauses noted. The palpation sensation was thought to be noncardiac. A/P: She continues to have sensation on occasion. Mostly when seated or laying down. Does not appear to correspond with eating and she denies acid taste in mouth, or burning.

## 2022-07-20 NOTE — Assessment & Plan Note (Signed)
Patient would like to lose weight. Current BMI is at 35. She has a significant history of heart disease in her family. Her weight is stable from 1 year ago. She states that she knows that dietary changes that she would like to make, but hasn't acted on them yet. 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 She has a goal to go to the gym at the hospital more often. A/P: She is not currently interested in PREP program or referral to dietician. Follow-up in 3 months

## 2022-07-21 LAB — BMP8+ANION GAP
Anion Gap: 16 mmol/L (ref 10.0–18.0)
BUN/Creatinine Ratio: 24 — ABNORMAL HIGH (ref 9–23)
BUN: 21 mg/dL (ref 6–24)
CO2: 22 mmol/L (ref 20–29)
Calcium: 10.4 mg/dL — ABNORMAL HIGH (ref 8.7–10.2)
Chloride: 101 mmol/L (ref 96–106)
Creatinine, Ser: 0.87 mg/dL (ref 0.57–1.00)
Glucose: 99 mg/dL (ref 70–99)
Potassium: 3.9 mmol/L (ref 3.5–5.2)
Sodium: 139 mmol/L (ref 134–144)
eGFR: 85 mL/min/{1.73_m2} (ref >59)

## 2022-07-21 LAB — HEMOGLOBIN A1C
Est. average glucose Bld gHb Est-mCnc: 131 mg/dL
Hgb A1c MFr Bld: 6.2 % — ABNORMAL HIGH (ref 4.8–5.6)

## 2022-07-21 LAB — HCV AB W REFLEX TO QUANT PCR: HCV Ab: NONREACTIVE

## 2022-07-21 LAB — HCV INTERPRETATION

## 2022-07-24 ENCOUNTER — Encounter: Payer: Self-pay | Admitting: Internal Medicine

## 2022-07-25 NOTE — Progress Notes (Signed)
Internal Medicine Clinic Attending  Case discussed with Dr. Masters  At the time of the visit.  We reviewed the resident's history and exam and pertinent patient test results.  I agree with the assessment, diagnosis, and plan of care documented in the resident's note.  

## 2022-08-14 ENCOUNTER — Other Ambulatory Visit (HOSPITAL_COMMUNITY): Payer: Self-pay

## 2022-08-22 ENCOUNTER — Ambulatory Visit (HOSPITAL_COMMUNITY): Payer: Medicaid Other

## 2022-08-22 ENCOUNTER — Ambulatory Visit: Payer: Medicaid Other | Admitting: Vascular Surgery

## 2022-09-05 ENCOUNTER — Ambulatory Visit: Payer: Medicaid Other | Admitting: Vascular Surgery

## 2022-09-07 ENCOUNTER — Ambulatory Visit (HOSPITAL_BASED_OUTPATIENT_CLINIC_OR_DEPARTMENT_OTHER): Payer: Medicaid Other | Admitting: Cardiology

## 2022-09-16 ENCOUNTER — Other Ambulatory Visit: Payer: Self-pay | Admitting: Internal Medicine

## 2022-09-16 DIAGNOSIS — I159 Secondary hypertension, unspecified: Secondary | ICD-10-CM

## 2022-09-17 ENCOUNTER — Other Ambulatory Visit (HOSPITAL_COMMUNITY): Payer: Self-pay

## 2022-09-17 MED ORDER — CARVEDILOL 6.25 MG PO TABS
6.2500 mg | ORAL_TABLET | Freq: Two times a day (BID) | ORAL | 3 refills | Status: DC
  Filled 2022-09-17 – 2022-11-12 (×2): qty 180, 90d supply, fill #0
  Filled 2023-02-11: qty 180, 90d supply, fill #1
  Filled 2023-05-13: qty 180, 90d supply, fill #2
  Filled 2023-08-07: qty 180, 90d supply, fill #3

## 2022-10-15 ENCOUNTER — Other Ambulatory Visit (HOSPITAL_COMMUNITY): Payer: Self-pay

## 2022-11-12 ENCOUNTER — Other Ambulatory Visit (HOSPITAL_COMMUNITY): Payer: Self-pay

## 2022-11-12 ENCOUNTER — Other Ambulatory Visit: Payer: Self-pay

## 2022-11-12 ENCOUNTER — Other Ambulatory Visit: Payer: Self-pay | Admitting: Internal Medicine

## 2022-11-12 DIAGNOSIS — I159 Secondary hypertension, unspecified: Secondary | ICD-10-CM

## 2022-11-12 DIAGNOSIS — I2609 Other pulmonary embolism with acute cor pulmonale: Secondary | ICD-10-CM

## 2022-11-15 ENCOUNTER — Other Ambulatory Visit (HOSPITAL_COMMUNITY): Payer: Self-pay

## 2022-11-15 MED ORDER — APIXABAN 5 MG PO TABS
5.0000 mg | ORAL_TABLET | Freq: Two times a day (BID) | ORAL | 3 refills | Status: DC
  Filled 2022-11-15: qty 180, 90d supply, fill #0
  Filled 2023-02-11: qty 180, 90d supply, fill #1
  Filled 2023-05-13: qty 180, 90d supply, fill #2
  Filled 2023-08-07: qty 180, 90d supply, fill #3

## 2022-12-11 ENCOUNTER — Other Ambulatory Visit (HOSPITAL_COMMUNITY): Payer: Self-pay

## 2022-12-11 ENCOUNTER — Encounter (HOSPITAL_COMMUNITY): Payer: Self-pay

## 2022-12-13 ENCOUNTER — Other Ambulatory Visit (HOSPITAL_COMMUNITY): Payer: Self-pay

## 2023-02-14 ENCOUNTER — Other Ambulatory Visit (HOSPITAL_COMMUNITY): Payer: Self-pay

## 2023-06-04 ENCOUNTER — Other Ambulatory Visit: Payer: Self-pay

## 2023-06-04 ENCOUNTER — Other Ambulatory Visit (HOSPITAL_COMMUNITY): Payer: Self-pay

## 2023-06-04 ENCOUNTER — Other Ambulatory Visit (HOSPITAL_BASED_OUTPATIENT_CLINIC_OR_DEPARTMENT_OTHER): Payer: Self-pay | Admitting: Cardiology

## 2023-06-04 DIAGNOSIS — I10 Essential (primary) hypertension: Secondary | ICD-10-CM

## 2023-06-04 MED ORDER — CHLORTHALIDONE 25 MG PO TABS
25.0000 mg | ORAL_TABLET | Freq: Every day | ORAL | 0 refills | Status: DC
Start: 2023-06-04 — End: 2023-10-18
  Filled 2023-06-04: qty 30, 30d supply, fill #0

## 2023-07-01 ENCOUNTER — Other Ambulatory Visit: Payer: Self-pay

## 2023-07-01 ENCOUNTER — Ambulatory Visit: Payer: 59 | Admitting: Student

## 2023-07-01 ENCOUNTER — Encounter: Payer: Self-pay | Admitting: Student

## 2023-07-01 ENCOUNTER — Other Ambulatory Visit (HOSPITAL_COMMUNITY): Payer: Self-pay

## 2023-07-01 VITALS — BP 146/99 | HR 79 | Temp 98.3°F | Ht 65.0 in | Wt 195.1 lb

## 2023-07-01 DIAGNOSIS — Z6830 Body mass index (BMI) 30.0-30.9, adult: Secondary | ICD-10-CM | POA: Diagnosis not present

## 2023-07-01 DIAGNOSIS — E669 Obesity, unspecified: Secondary | ICD-10-CM

## 2023-07-01 DIAGNOSIS — Z13220 Encounter for screening for lipoid disorders: Secondary | ICD-10-CM | POA: Diagnosis not present

## 2023-07-01 DIAGNOSIS — Z794 Long term (current) use of insulin: Secondary | ICD-10-CM

## 2023-07-01 DIAGNOSIS — Z86711 Personal history of pulmonary embolism: Secondary | ICD-10-CM | POA: Diagnosis not present

## 2023-07-01 DIAGNOSIS — I1A0 Resistant hypertension: Secondary | ICD-10-CM | POA: Diagnosis not present

## 2023-07-01 DIAGNOSIS — E6609 Other obesity due to excess calories: Secondary | ICD-10-CM

## 2023-07-01 DIAGNOSIS — R3589 Other polyuria: Secondary | ICD-10-CM | POA: Diagnosis not present

## 2023-07-01 DIAGNOSIS — E1169 Type 2 diabetes mellitus with other specified complication: Secondary | ICD-10-CM | POA: Diagnosis not present

## 2023-07-01 DIAGNOSIS — E079 Disorder of thyroid, unspecified: Secondary | ICD-10-CM | POA: Diagnosis not present

## 2023-07-01 DIAGNOSIS — Z7984 Long term (current) use of oral hypoglycemic drugs: Secondary | ICD-10-CM

## 2023-07-01 DIAGNOSIS — R7303 Prediabetes: Secondary | ICD-10-CM

## 2023-07-01 DIAGNOSIS — E1165 Type 2 diabetes mellitus with hyperglycemia: Secondary | ICD-10-CM

## 2023-07-01 LAB — BASIC METABOLIC PANEL
Anion gap: 13 (ref 5–15)
BUN: 11 mg/dL (ref 6–20)
CO2: 25 mmol/L (ref 22–32)
Calcium: 9.4 mg/dL (ref 8.9–10.3)
Chloride: 90 mmol/L — ABNORMAL LOW (ref 98–111)
Creatinine, Ser: 0.86 mg/dL (ref 0.44–1.00)
GFR, Estimated: >60 mL/min (ref >60)
Glucose, Bld: 350 mg/dL — ABNORMAL HIGH (ref 70–99)
Potassium: 3.9 mmol/L (ref 3.5–5.1)
Sodium: 128 mmol/L — ABNORMAL LOW (ref 135–145)

## 2023-07-01 LAB — POCT GLYCOSYLATED HEMOGLOBIN (HGB A1C): Hemoglobin A1C: 10.8 % — AB (ref 4.0–5.6)

## 2023-07-01 LAB — POCT URINALYSIS DIPSTICK
Bilirubin, UA: NEGATIVE
Glucose, UA: POSITIVE — AB
Ketones, UA: 80
Nitrite, UA: NEGATIVE
Protein, UA: NEGATIVE
Spec Grav, UA: 1.015 (ref 1.010–1.025)
Urobilinogen, UA: 0.2 E.U./dL
pH, UA: 7 (ref 5.0–8.0)

## 2023-07-01 LAB — GLUCOSE, CAPILLARY: Glucose-Capillary: 309 mg/dL — ABNORMAL HIGH (ref 70–99)

## 2023-07-01 MED ORDER — INSULIN PEN NEEDLE 32G X 4 MM MISC
2 refills | Status: AC
Start: 2023-07-01 — End: ?
  Filled 2023-07-01: qty 100, 90d supply, fill #0
  Filled 2023-09-21: qty 100, 90d supply, fill #1
  Filled 2023-12-18: qty 100, 90d supply, fill #2

## 2023-07-01 MED ORDER — INSULIN GLARGINE-YFGN 100 UNIT/ML ~~LOC~~ SOPN
15.0000 [IU] | PEN_INJECTOR | Freq: Every day | SUBCUTANEOUS | 11 refills | Status: DC
  Filled 2023-07-01 – 2023-07-03 (×2): qty 6, 40d supply, fill #0
  Filled 2023-08-06: qty 6, 40d supply, fill #1
  Filled 2023-09-08: qty 6, 40d supply, fill #2
  Filled 2023-10-06 – 2023-10-12 (×2): qty 6, 40d supply, fill #3
  Filled 2023-11-20: qty 6, 40d supply, fill #4
  Filled 2024-01-02: qty 6, 40d supply, fill #5

## 2023-07-01 MED ORDER — GLUCOSE BLOOD VI STRP
ORAL_STRIP | 12 refills | Status: AC
Start: 2023-07-01 — End: ?
  Filled 2023-07-01: qty 50, 50d supply, fill #0
  Filled 2023-07-05: qty 100, 25d supply, fill #0
  Filled 2023-07-30 (×2): qty 100, 25d supply, fill #1
  Filled 2023-08-25: qty 100, 25d supply, fill #2
  Filled 2023-10-06: qty 100, 25d supply, fill #3
  Filled 2023-11-03: qty 100, 25d supply, fill #4
  Filled 2023-12-18: qty 100, 25d supply, fill #5
  Filled 2024-01-15: qty 100, 25d supply, fill #6
  Filled 2024-02-07: qty 100, 25d supply, fill #7
  Filled 2024-04-29: qty 100, 25d supply, fill #8

## 2023-07-01 MED ORDER — ACCU-CHEK GUIDE W/DEVICE KIT
PACK | 0 refills | Status: DC
Start: 2023-07-01 — End: 2024-06-15
  Filled 2023-07-01: qty 1, 30d supply, fill #0

## 2023-07-01 MED ORDER — MOUNJARO 2.5 MG/0.5ML ~~LOC~~ SOAJ
2.5000 mg | SUBCUTANEOUS | 3 refills | Status: DC
  Filled 2023-07-01: qty 2, 28d supply, fill #0
  Filled 2023-07-18: qty 2, 28d supply, fill #1

## 2023-07-01 MED ORDER — METFORMIN HCL 500 MG PO TABS
500.0000 mg | ORAL_TABLET | Freq: Two times a day (BID) | ORAL | 11 refills | Status: DC
  Filled 2023-07-01: qty 180, 90d supply, fill #0

## 2023-07-01 NOTE — Assessment & Plan Note (Signed)
Palpable. No obstructive symptoms today.  Will check TSH today, if abnormal, recheck thyroid US

## 2023-07-01 NOTE — Progress Notes (Signed)
Subjective:  CC: Chronic condition follow up  HPI:  [name] is a 42 y.o. female with a past medical history stated below and presents today for hypertension and polyuria. . Please see problem based assessment and plan for additional details.  Past Medical History:  Diagnosis Date   History of DVT (deep vein thrombosis)    Hypertension    Lymphadenitis 08/11/2021   Obesity (BMI 30-39.9)    Palpitations    Personal history of pulmonary embolism    Venous insufficiency     Current Outpatient Medications on File Prior to Visit  Medication Sig Dispense Refill   acetaminophen (TYLENOL) 325 MG tablet Take 1 tablet (325 mg total) by mouth every 4 (four) hours as needed for headache or mild pain.     amLODipine (NORVASC) 10 MG tablet Take 1 tablet (10 mg total) by mouth daily. 30 tablet 11   apixaban (ELIQUIS) 5 MG TABS tablet Take 1 tablet (5 mg total) by mouth 2 (two) times daily. 180 tablet 3   carvedilol (COREG) 6.25 MG tablet Take 1 tablet (6.25 mg total) by mouth 2 (two) times daily with a meal. 180 tablet 3   chlorthalidone (HYGROTON) 25 MG tablet Take 1 tablet (25 mg total) by mouth daily. Please call our office to schedule an overdue appointment with Dr. Cristal Deer before anymore refills. [phone]. 30 tablet 0   losartan (COZAAR) 100 MG tablet Take 1 tablet (100 mg total) by mouth daily. 30 tablet 11   Multiple Vitamins-Minerals (THRIVE FOR LIFE WOMENS) TABS Take 2 tablets by mouth daily. By Level     spironolactone (ALDACTONE) 25 MG tablet Take 1 tablet (25 mg total) by mouth daily. 30 tablet 11   No current facility-administered medications on file prior to visit.    Family History  Problem Relation Age of Onset   Diabetes Mother    Heart attack Father 15   Heart attack Paternal Grandfather     Social History   Socioeconomic History   Marital status: Single    Spouse name: Not on file   Number of children: Not on file   Years of education: Not on  file   Highest education level: Not on file  Occupational History   Not on file  Tobacco Use   Smoking status: Never    Passive exposure: Never   Smokeless tobacco: Never  Substance and Sexual Activity   Alcohol use: No   Drug use: No   Sexual activity: Not on file  Other Topics Concern   Not on file  Social History Narrative   Not on file   Social Determinants of Health   Financial Resource Strain: Low Risk  (06/04/2022)   Overall Financial Resource Strain (CARDIA)    Difficulty of Paying Living Expenses: Not hard at all  Food Insecurity: No Food Insecurity (06/04/2022)   Hunger Vital Sign    Worried About Running Out of Food in the Last Year: Never true    Ran Out of Food in the Last Year: Never true  Transportation Needs: No Transportation Needs (06/04/2022)   PRAPARE - Administrator, Civil Service (Medical): No    Lack of Transportation (Non-Medical): No  Physical Activity: Inactive (06/04/2022)   Exercise Vital Sign    Days of Exercise per Week: 0 days    Minutes of Exercise per Session: 0 min  Stress: Not on file  Social Connections: Not on file  Intimate Partner Violence: Not on file  Review of Systems: ROS negative except for what is noted on the assessment and plan.  Objective:   Vitals:   07/01/23 0857 07/01/23 0940  BP: (!) 141/90 (!) 146/99  Pulse: 83 79  Temp: 98.3 F (36.8 C)   TempSrc: Oral   SpO2: 98%   Weight: 195 lb 1.6 oz (88.5 kg)   Height: 5\' 5"  (1.651 m)     Physical Exam: Constitutional: well-appearing woman sitting in chair, in no acute distress HENT: normocephalic atraumatic, mucous membranes moist Eyes: conjunctiva non-erythematous Neck: supple, thyroid mass palpable R>L, Cardiovascular: regular rate and rhythm, no m/r/g, 2+ radial, DP and TP pulses Pulmonary/Chest: normal work of breathing on room air, lungs clear to auscultation bilaterally Abdominal: soft, non-tender, non-distended MSK: normal bulk and tone, no  lower extremity edema Neurological: alert & oriented x 3, 5/5 strength in bilateral upper and lower extremities, normal gait Skin: warm and dry Psych: Pleasant mood and affect       07/01/2023    8:55 AM  Depression screen PHQ 2/9  Decreased Interest 0  Down, Depressed, Hopeless 0  PHQ - 2 Score 0  Altered sleeping 3  Tired, decreased energy 0  Change in appetite 0  Feeling bad or failure about yourself  0  Trouble concentrating 0  Moving slowly or fidgety/restless 0  Suicidal thoughts 0  PHQ-9 Score 3  Difficult doing work/chores Not difficult at all        No data to display           Assessment & Plan:   Resistant hypertension Patient has been adherent to her antihypertensive regimen and dispense history supports it. Currently asymptomatic; however not at goal. She reports did not take her meds this AM but her home readings SBP 120-125 and diastolic <90s. Vitals:   07/01/23 0857 07/01/23 0940  BP: (!) 141/90 (!) 146/99   Previously well controlled. Prior renin/aldosterone level wnl. STOP bang score today +2, but patient does not snore. TSH wnl previously despite thyroid mass. Will continue current regimen; she will follow up in 1 month to assess need for medication titration/switching.  -BMP today Continue  -Chlorthalidone 25 mg daily -Spironolactone 25 mg daily -Losartan 100 mg daily -Carvedilol 6.25 mg BID -Amlodipine 10 mg dialy  Type 2 diabetes mellitus with obesity (HCC) Previous history of prediabetes; presented today with ~6 months of weight loss, polydipsia and polyuria. She also noted her vision was clouded and went to ophthalmologist, who noted her prescription changed dramatically and recommended she was seen by PCP. Today she denies HA, nausea, vomiting, abdominal pain, lightheadedness, palpitations. No claudication or neuropathic symptoms. No vaginal discharge or dysuria.  Evaluation today: Lab Results  Component Value Date   HGBA1C 10.8 (A)  07/01/2023      Latest Ref Rng & Units 07/01/2023    9:34 AM 07/20/2022   10:10 AM 06/09/2021   10:51 AM  BMP  Glucose 70 - 99 mg/dL 161  99  86   BUN 6 - 20 mg/dL 11  21  13    Creatinine 0.44 - 1.00 mg/dL 0.96  0.45  4.09   BUN/Creat Ratio 9 - 23  24  18    Sodium 135 - 145 mmol/L 128  139  137   Potassium 3.5 - 5.1 mmol/L 3.9  3.9  4.8   Chloride 98 - 111 mmol/L 90  101  99   CO2 22 - 32 mmol/L 25  22  20    Calcium 8.9 - 10.3 mg/dL  9.4  10.4  10.1   Urinalysis    Component Value Date/Time   COLORURINE YELLOW 06/01/2021 0459   APPEARANCEUR CLEAR 06/01/2021 0459   LABSPEC 1.045 (H) 06/01/2021 0459   PHURINE 6.0 06/01/2021 0459   GLUCOSEU NEGATIVE 06/01/2021 0459   HGBUR MODERATE (A) 06/01/2021 0459   BILIRUBINUR NEGATIVE 06/01/2021 0459   KETONESUR 20 (A) 06/01/2021 0459   PROTEINUR NEGATIVE 06/01/2021 0459   NITRITE NEGATIVE 06/01/2021 0459   LEUKOCYTESUR NEGATIVE 06/01/2021 0459    Hgb/RBC- patient currently on her menstrual period   Evaluation today significant for progression to diabetes. No evidence of diabetic emergency at this time, however, discussed with patient need for symptom monitoring to seek prompt evaluation. Will start basal insulin therapy with the hope to downtitrate once in better control AND start oral medications with metformin 500 mg daily and Mounjaro as she meets criteria for cardio protective effects.  -Start Lantus 15 units daily -Reassess in 1-2 weeks -Start Metformin 500 daily for the first week and 500 mg BID for the second week -Start Mounjaro 2.5 mg/weekly -Provided instruction on how to use insulin and Mounjaro over the phone -urine ACR today    Obesity (BMI 35.0-39.9 without comorbidity) Discussed lifestyle modifications now that she has diabetes diagnosis. Referral to nutrition services  Thyroid mass Palpable. No obstructive symptoms today.  Will check TSH today, if abnormal, recheck thyroid US  History of pulmonary embolism Continues  on Eliquis therapy, no overt signs of bleeding  Screening for hyperlipidemia Lipid panel today    Return in about 1 week (around 07/08/2023) for in 1 week for new diagnosis T2DM on insulin and 3 months for HTN.  Patient discussed with Dr. Allyn Kenner, MD [facility] Internal Medicine Program - PGY-2 07/01/2023, 1:32 PM

## 2023-07-01 NOTE — Patient Instructions (Addendum)
Thank you, [name] for allowing [name] to provide your care today. Today we discussed your blood pressure, your frequent urination and history of pre-diabetes, and your healthcare maintenace  Blood pressure: Come back in one month for BP check I want you to bring your blood pressure log: Blood pressure tips   It is best to check your BP 1-2 hours after taking your medications to see the medications effectiveness on your BP.    Here are some tips that our clinical pharmacists share for home BP monitoring:          Rest 10 minutes before taking your blood pressure.          Don't smoke or drink caffeinated beverages for at least 30 minutes before.          Take your blood pressure before (not after) you eat.          Sit comfortably with your back supported and both feet on the floor (don't cross your legs).          Elevate your arm to heart level on a table or a desk.          Use the proper sized cuff. It should fit smoothly and snugly around your bare upper arm. There should be enough room to slip a fingertip under the cuff. The bottom edge of the cuff should be 1 inch above the crease of the elbow. Take your medications 1-2 hrs before the appointment  Diabetes concerns: -check your A1c, your kidney function and your urine protein  Thyroid Check TSH to monitor your thyroid hormone levels IF abnormal or if your thyroid mass is giving you trouble, we will follow it up with an ultrasounds  Please take the flu vaccine when available!   I have ordered the following labs for you:  Lab Orders         BMP w Anion Gap (STAT/Sunquest-performed on-site)         Urinalysis, dipstick only         Lipid Profile         TSH         Microalbumin / Creatinine Urine Ratio         POCT HgB A1c and Glucose      I will call if any are abnormal. All of your labs can be accessed through "My Chart".   My Chart  Access: [email or web address]  Please follow-up in: 1 months    We look forward to seeing you next time. Please call our clinic at [phone] if you have any questions or concerns. The best time to call is Monday-Friday from 9am-4pm, but there is someone available 24/7. If after hours or the weekend, call the main hospital number and ask for the Internal Medicine Resident On-Call. If you need medication refills, please notify your pharmacy one week in advance and they will send [name] a request.   Thank you for letting us take part in your care. Wishing you the best!  Morene Crocker, MD 07/01/2023, 9:27 AM [facility] Internal Medicine Resident, PGY-2

## 2023-07-01 NOTE — Assessment & Plan Note (Signed)
Discussed lifestyle modifications now that she has diabetes diagnosis. Referral to nutrition services

## 2023-07-01 NOTE — Assessment & Plan Note (Addendum)
Previous history of prediabetes; presented today with ~6 months of weight loss, polydipsia and polyuria. She also noted her vision was clouded and went to ophthalmologist, who noted her prescription changed dramatically and recommended she was seen by PCP. Today she denies HA, nausea, vomiting, abdominal pain, lightheadedness, palpitations. No claudication or neuropathic symptoms. No vaginal discharge or dysuria.  Evaluation today: Lab Results  Component Value Date   HGBA1C 10.8 (A) 07/01/2023      Latest Ref Rng & Units 07/01/2023    9:34 AM 07/20/2022   10:10 AM 06/09/2021   10:51 AM  BMP  Glucose 70 - 99 mg/dL 962  99  86   BUN 6 - 20 mg/dL 11  21  13    Creatinine 0.44 - 1.00 mg/dL 9.52  8.41  3.24   BUN/Creat Ratio 9 - 23  24  18    Sodium 135 - 145 mmol/L 128  139  137   Potassium 3.5 - 5.1 mmol/L 3.9  3.9  4.8   Chloride 98 - 111 mmol/L 90  101  99   CO2 22 - 32 mmol/L 25  22  20    Calcium 8.9 - 10.3 mg/dL 9.4  40.1  02.7   Urinalysis    Component Value Date/Time   COLORURINE YELLOW 06/01/2021 0459   APPEARANCEUR CLEAR 06/01/2021 0459   LABSPEC 1.045 (H) 06/01/2021 0459   PHURINE 6.0 06/01/2021 0459   GLUCOSEU NEGATIVE 06/01/2021 0459   HGBUR MODERATE (A) 06/01/2021 0459   BILIRUBINUR NEGATIVE 06/01/2021 0459   KETONESUR 20 (A) 06/01/2021 0459   PROTEINUR NEGATIVE 06/01/2021 0459   NITRITE NEGATIVE 06/01/2021 0459   LEUKOCYTESUR NEGATIVE 06/01/2021 0459    Hgb/RBC- patient currently on her menstrual period   Evaluation today significant for progression to diabetes. No evidence of diabetic emergency at this time, however, discussed with patient need for symptom monitoring to seek prompt evaluation. Will start basal insulin therapy with the hope to downtitrate once in better control AND start oral medications with metformin 500 mg daily and Mounjaro as she meets criteria for cardio protective effects.  -Start Lantus 15 units daily -Reassess in 1-2 weeks -Start Metformin 500  daily for the first week and 500 mg BID for the second week -Start Mounjaro 2.5 mg/weekly -Provided instruction on how to use insulin and Mounjaro over the phone -urine ACR today

## 2023-07-01 NOTE — Assessment & Plan Note (Signed)
Lipid panel today

## 2023-07-01 NOTE — Addendum Note (Signed)
Addended by: Maura Crandall on: 07/01/2023 05:03 PM   Modules accepted: Orders

## 2023-07-01 NOTE — Assessment & Plan Note (Signed)
Continues on Eliquis therapy, no overt signs of bleeding

## 2023-07-01 NOTE — Assessment & Plan Note (Addendum)
Patient has been adherent to her antihypertensive regimen and dispense history supports it. Currently asymptomatic; however not at goal. She reports did not take her meds this AM but her home readings SBP 120-125 and diastolic <90s. Vitals:   07/01/23 0857 07/01/23 0940  BP: (!) 141/90 (!) 146/99   Previously well controlled. Prior renin/aldosterone level wnl. STOP bang score today +2, but patient does not snore. TSH wnl previously despite thyroid mass. Will continue current regimen; she will follow up in 1 month to assess need for medication titration/switching.  -BMP today Continue  -Chlorthalidone 25 mg daily -Spironolactone 25 mg daily -Losartan 100 mg daily -Carvedilol 6.25 mg BID -Amlodipine 10 mg dialy

## 2023-07-02 LAB — TSH: TSH: 1.31 u[IU]/mL (ref 0.450–4.500)

## 2023-07-02 LAB — LIPID PANEL
Chol/HDL Ratio: 5.6 ratio — ABNORMAL HIGH (ref 0.0–4.4)
Cholesterol, Total: 186 mg/dL (ref 100–199)
HDL: 33 mg/dL — ABNORMAL LOW (ref >39)
LDL Chol Calc (NIH): 76 mg/dL (ref 0–99)
Triglycerides: 487 mg/dL — ABNORMAL HIGH (ref 0–149)
VLDL Cholesterol Cal: 77 mg/dL — ABNORMAL HIGH (ref 5–40)

## 2023-07-02 LAB — MICROALBUMIN / CREATININE URINE RATIO
Creatinine, Urine: 82.2 mg/dL
Microalb/Creat Ratio: 11 mg/g{creat} (ref 0–29)
Microalbumin, Urine: 8.7 ug/mL

## 2023-07-02 NOTE — Progress Notes (Signed)
Internal Medicine Clinic Attending  Case discussed with the resident at the time of the visit.  We reviewed the resident's history and exam and pertinent patient test results.  I agree with the assessment, diagnosis, and plan of care documented in the resident's note.  

## 2023-07-03 ENCOUNTER — Other Ambulatory Visit: Payer: Self-pay

## 2023-07-03 ENCOUNTER — Other Ambulatory Visit (HOSPITAL_COMMUNITY): Payer: Self-pay

## 2023-07-04 ENCOUNTER — Other Ambulatory Visit (HOSPITAL_COMMUNITY): Payer: Self-pay

## 2023-07-05 ENCOUNTER — Other Ambulatory Visit (HOSPITAL_COMMUNITY): Payer: Self-pay

## 2023-07-08 ENCOUNTER — Other Ambulatory Visit: Payer: Self-pay | Admitting: Internal Medicine

## 2023-07-08 DIAGNOSIS — I159 Secondary hypertension, unspecified: Secondary | ICD-10-CM

## 2023-07-08 NOTE — Progress Notes (Signed)
Results discussed with patient, who was started on insulin on same day of clinic appointment. At follow up, please start on moderate statin in addition to glucose readings and insulin therapy follow up.

## 2023-07-09 ENCOUNTER — Telehealth: Payer: Self-pay | Admitting: Dietician

## 2023-07-09 NOTE — Telephone Encounter (Signed)
Sabrina Mclaughlin called wanting to know if she was supposed to keep taking her insulin and how long when her blood sugars are coming down nicely. She states she has taken her Insulin,metformin, and Mounjaro since Sunday Starting blood sugar was 321, then Monday it was 243, and This am 193, took all meds today. Patient has follow up next week, reviewed usual target blood glucose and symptoms of low blood sugar and encouraged her to continue taking her medications until her visit next week. Encouraged her to call if she has symptoms of low blood sugar that do not go away with treatment or glucose less than 80 mg/dl. She verbalized understanding.

## 2023-07-11 ENCOUNTER — Other Ambulatory Visit (HOSPITAL_COMMUNITY): Payer: Self-pay

## 2023-07-11 MED ORDER — LOSARTAN POTASSIUM 100 MG PO TABS
100.0000 mg | ORAL_TABLET | Freq: Every day | ORAL | 11 refills | Status: DC
Start: 2023-07-11 — End: 2023-10-18
  Filled 2023-07-11: qty 30, 30d supply, fill #0
  Filled 2023-08-07: qty 30, 30d supply, fill #1
  Filled 2023-09-08: qty 30, 30d supply, fill #2
  Filled 2023-10-06: qty 30, 30d supply, fill #3

## 2023-07-11 MED ORDER — AMLODIPINE BESYLATE 10 MG PO TABS
10.0000 mg | ORAL_TABLET | Freq: Every day | ORAL | 11 refills | Status: DC
Start: 2023-07-11 — End: 2023-07-18
  Filled 2023-07-11: qty 30, 30d supply, fill #0

## 2023-07-11 MED ORDER — SPIRONOLACTONE 25 MG PO TABS
25.0000 mg | ORAL_TABLET | Freq: Every day | ORAL | 11 refills | Status: DC
Start: 2023-07-11 — End: 2023-10-18
  Filled 2023-07-11: qty 30, 30d supply, fill #0
  Filled 2023-08-07: qty 30, 30d supply, fill #1
  Filled 2023-09-08: qty 30, 30d supply, fill #2
  Filled 2023-10-06: qty 30, 30d supply, fill #3

## 2023-07-12 ENCOUNTER — Other Ambulatory Visit (HOSPITAL_COMMUNITY): Payer: Self-pay

## 2023-07-16 ENCOUNTER — Other Ambulatory Visit (HOSPITAL_COMMUNITY): Payer: Self-pay

## 2023-07-18 ENCOUNTER — Other Ambulatory Visit (HOSPITAL_COMMUNITY): Payer: Self-pay

## 2023-07-18 ENCOUNTER — Other Ambulatory Visit: Payer: Self-pay

## 2023-07-18 ENCOUNTER — Ambulatory Visit (INDEPENDENT_AMBULATORY_CARE_PROVIDER_SITE_OTHER): Payer: 59 | Admitting: Student

## 2023-07-18 ENCOUNTER — Encounter: Payer: Self-pay | Admitting: Student

## 2023-07-18 VITALS — BP 129/84 | HR 69 | Temp 97.8°F | Ht 65.0 in | Wt 192.0 lb

## 2023-07-18 DIAGNOSIS — E669 Obesity, unspecified: Secondary | ICD-10-CM

## 2023-07-18 DIAGNOSIS — E1165 Type 2 diabetes mellitus with hyperglycemia: Secondary | ICD-10-CM

## 2023-07-18 DIAGNOSIS — Z6830 Body mass index (BMI) 30.0-30.9, adult: Secondary | ICD-10-CM

## 2023-07-18 DIAGNOSIS — E1169 Type 2 diabetes mellitus with other specified complication: Secondary | ICD-10-CM

## 2023-07-18 DIAGNOSIS — I1A0 Resistant hypertension: Secondary | ICD-10-CM

## 2023-07-18 LAB — GLUCOSE, CAPILLARY: Glucose-Capillary: 89 mg/dL (ref 70–99)

## 2023-07-18 MED ORDER — TIRZEPATIDE 5 MG/0.5ML ~~LOC~~ SOAJ
5.0000 mg | SUBCUTANEOUS | 3 refills | Status: DC
  Filled 2023-07-18 – 2023-07-30 (×2): qty 2, 28d supply, fill #0
  Filled 2023-08-25: qty 2, 28d supply, fill #1
  Filled 2023-09-21: qty 2, 28d supply, fill #2
  Filled 2023-10-12 – 2023-10-23 (×2): qty 2, 28d supply, fill #3

## 2023-07-18 MED ORDER — AMLODIPINE BESYLATE 5 MG PO TABS
5.0000 mg | ORAL_TABLET | Freq: Every day | ORAL | 11 refills | Status: DC
Start: 2023-07-18 — End: 2023-10-18
  Filled 2023-07-18: qty 30, 30d supply, fill #0
  Filled 2023-08-11: qty 30, 30d supply, fill #1
  Filled 2023-09-08: qty 30, 30d supply, fill #2
  Filled 2023-10-06: qty 30, 30d supply, fill #3

## 2023-07-18 MED ORDER — METFORMIN HCL 500 MG PO TABS
1000.0000 mg | ORAL_TABLET | Freq: Two times a day (BID) | ORAL | Status: DC
Start: 2023-07-18 — End: 2023-08-16

## 2023-07-18 MED ORDER — ROSUVASTATIN CALCIUM 5 MG PO TABS
5.0000 mg | ORAL_TABLET | Freq: Every day | ORAL | 11 refills | Status: DC
Start: 2023-07-18 — End: 2023-10-18
  Filled 2023-07-18: qty 30, 30d supply, fill #0
  Filled 2023-08-11: qty 30, 30d supply, fill #1
  Filled 2023-09-08: qty 30, 30d supply, fill #2
  Filled 2023-10-06: qty 30, 30d supply, fill #3

## 2023-07-18 NOTE — Progress Notes (Signed)
CC: Diabetes follow-up  HPI: Sabrina Mclaughlin is a 42 y.o. female living with a history stated below and presents today for diabetes follow-up. Please see problem based assessment and plan for additional details.  Past Medical History:  Diagnosis Date   History of DVT (deep vein thrombosis)    Hypertension    Lymphadenitis 08/11/2021   Obesity (BMI 30-39.9)    Palpitations    Personal history of pulmonary embolism    Venous insufficiency     Current Outpatient Medications on File Prior to Visit  Medication Sig Dispense Refill   acetaminophen (TYLENOL) 325 MG tablet Take 1 tablet (325 mg total) by mouth every 4 (four) hours as needed for headache or mild pain.     apixaban (ELIQUIS) 5 MG TABS tablet Take 1 tablet (5 mg total) by mouth 2 (two) times daily. 180 tablet 3   Blood Glucose Monitoring Suppl (ACCU-CHEK GUIDE) w/Device KIT Please check your blood sugars daily before you eat breakfast. 1 kit 0   carvedilol (COREG) 6.25 MG tablet Take 1 tablet (6.25 mg total) by mouth 2 (two) times daily with a meal. 180 tablet 3   chlorthalidone (HYGROTON) 25 MG tablet Take 1 tablet (25 mg total) by mouth daily. Please call our office to schedule an overdue appointment with Dr. Cristal Deer before anymore refills. [phone]. 30 tablet 0   glucose blood test strip Use as instructed 100 each 12   insulin glargine-yfgn (SEMGLEE, YFGN,) 100 UNIT/ML Pen Inject 15 Units into the skin daily. 15 mL 11   Insulin Pen Needle 32G X 4 MM MISC Use to inject as directed about once daily. Change needles with every use, 100 each 2   losartan (COZAAR) 100 MG tablet Take 1 tablet (100 mg total) by mouth daily. 30 tablet 11   Multiple Vitamins-Minerals (THRIVE FOR LIFE WOMENS) TABS Take 2 tablets by mouth daily. By Level     spironolactone (ALDACTONE) 25 MG tablet Take 1 tablet (25 mg total) by mouth daily. 30 tablet 11   No current facility-administered medications on file prior to visit.    Family History   Problem Relation Age of Onset   Diabetes Mother    Heart attack Father 73   Heart attack Paternal Grandfather     Social History   Socioeconomic History   Marital status: Single    Spouse name: Not on file   Number of children: Not on file   Years of education: Not on file   Highest education level: Not on file  Occupational History   Not on file  Tobacco Use   Smoking status: Never    Passive exposure: Never   Smokeless tobacco: Never  Substance and Sexual Activity   Alcohol use: No   Drug use: No   Sexual activity: Not on file  Other Topics Concern   Not on file  Social History Narrative   Not on file   Social Determinants of Health   Financial Resource Strain: Low Risk  (06/04/2022)   Overall Financial Resource Strain (CARDIA)    Difficulty of Paying Living Expenses: Not hard at all  Food Insecurity: No Food Insecurity (06/04/2022)   Hunger Vital Sign    Worried About Running Out of Food in the Last Year: Never true    Ran Out of Food in the Last Year: Never true  Transportation Needs: No Transportation Needs (06/04/2022)   PRAPARE - Administrator, Civil Service (Medical): No    Lack of Transportation (  Non-Medical): No  Physical Activity: Inactive (06/04/2022)   Exercise Vital Sign    Days of Exercise per Week: 0 days    Minutes of Exercise per Session: 0 min  Stress: Not on file  Social Connections: Not on file  Intimate Partner Violence: Not on file    Review of Systems: ROS negative except for what is noted on the assessment and plan.  Vitals:   07/18/23 0847  BP: 129/84  Pulse: 69  Temp: 97.8 F (36.6 C)  TempSrc: Oral  SpO2: 100%  Weight: 192 lb (87.1 kg)  Height: 5\' 5"  (1.651 m)    Physical Exam: Constitutional: well-appearing in no acute distress HENT: normocephalic atraumatic, mucous membranes moist Eyes: conjunctiva non-erythematous Neck: supple Cardiovascular: regular rate and rhythm, no m/r/g, trace bilateral lower  extremity edema Pulmonary/Chest: normal work of breathing on room air, lungs clear to auscultation bilaterally Abdominal: soft, non-tender, non-distended MSK: normal bulk and tone Neurological: alert & oriented x 3, 5/5 strength in bilateral upper and lower extremities, normal gait Skin: warm and dry  Assessment & Plan:   Resistant hypertension Patient continues to be adherent to her antihypertensive regimen.  She remains asymptomatic and is at her goal.  Her blood pressure today is 129/84.  At home, her blood pressure has been in the 130s over 70s.  She does endorse peripheral edema in her lower extremities likely secondary to her amlodipine.  We have decreased her dosage to 5 mg and will follow-up at her next appointment. - Amlodipine 5 mg daily - Chlorthalidone 25 mg daily - Spironolactone 25 mg daily - Losartan 100 mg daily - Carvedilol 6.25 mg BID - Follow-up in 2 months for checkup  Type 2 diabetes mellitus with obesity (HCC) Patient recently diagnosed with diabetes.  Her last A1c 2 weeks ago was 10.8.  At her appointment, she endorses polyuria and polydipsia.  Today, her urinary symptoms have resolved.  She remains adherent to her medication regimen.  We discussed uptitrating her metformin and Mounjaro.  She has been testing her resting sugars in the morning that have been steadily in the 110s to 120s.  She denies any highs or lows.  She endorses diarrhea once weekly but may be secondary to her metformin. - Follow-up in 1 month - Mounjaro 5 mg weekly - Metformin 1000 in the morning and 500 in the evening starting next week; the following week, 1000 the morning and 1000 the evening - Continue Lantus 15 units daily - Begin rosuvastatin 5 mg daily - Follow-up with ophthalmology   Patient seen with Dr. Susa Raring, MD  [facility], PGY-1 Date 07/18/2023 Time 9:51 AM

## 2023-07-18 NOTE — Patient Instructions (Addendum)
Thank you so much for coming to the clinic today!   Today, we discussed your hypertension and diabetes. Please *STOP taking amlodipine 10 mg and *START taking amlodipine 5 mg.   For your diabetes, we have started you on rosuvastatin. For your Elmer, finish your fourth dose of 2.5 mg/weekly. After your fourth dose, start taking 5 mg/weekly. For your metformin, finish taking 500 mg in the morning and 500 mg in the evening. Next week, you will start taking 1000 mg in the morning and 500 mg in the evening. For the following week, start taking 1000 mg in the morning and 1000 mg in the evening. If you have any confusion, please send [name] a message through MyChart. I have put in refills for the new dosages of these medications.   If you have any questions please feel free to the call the clinic at anytime at [phone]. It was a pleasure seeing you!  Best, Dr. Rayvon Char

## 2023-07-18 NOTE — Progress Notes (Signed)
Messaged patient through MyChart discussing normal results.

## 2023-07-18 NOTE — Assessment & Plan Note (Signed)
Patient continues to be adherent to her antihypertensive regimen.  She remains asymptomatic and is at her goal.  Her blood pressure today is 129/84.  At home, her blood pressure has been in the 130s over 70s.  She does endorse peripheral edema in her lower extremities likely secondary to her amlodipine.  We have decreased her dosage to 5 mg and will follow-up at her next appointment. - Amlodipine 5 mg daily - Chlorthalidone 25 mg daily - Spironolactone 25 mg daily - Losartan 100 mg daily - Carvedilol 6.25 mg BID - Follow-up in 2 months for checkup

## 2023-07-18 NOTE — Assessment & Plan Note (Addendum)
Patient recently diagnosed with diabetes.  Her last A1c 2 weeks ago was 10.8.  At her appointment, she endorses polyuria and polydipsia.  Today, her urinary symptoms have resolved.  She remains adherent to her medication regimen.  We discussed uptitrating her metformin and Mounjaro.  She has been testing her resting sugars in the morning that have been steadily in the 110s to 120s.  She denies any highs or lows.  She endorses diarrhea once weekly but may be secondary to her metformin. - Follow-up in 1 month - Mounjaro 5 mg weekly - Metformin 1000 in the morning and 500 in the evening starting next week; the following week, 1000 the morning and 1000 the evening - Continue Lantus 15 units daily - Begin rosuvastatin 5 mg daily - Follow-up with ophthalmology

## 2023-07-18 NOTE — Progress Notes (Signed)
Internal Medicine Clinic Attending  I was physically present during the key portions of the resident provided service and participated in the medical decision making of patient's management care. I reviewed pertinent patient test results.  The assessment, diagnosis, and plan were formulated together and I agree with the documentation in the resident's note.  Mercie Eon, MD    Patient is doing really well with her new diabetes regimen.  At her 34-month follow-up appointment, she should be up to Metformin 1g BID & Mounjaro 5mg  weekly. At that visit, please review her blood sugars - we may be able to decrease her insulin dose. Can hopefully increase Mounjaro at the follow-up visit too.   At follow up, please also check BP & LE swelling on the lower dose of Amlodipine.

## 2023-07-19 ENCOUNTER — Other Ambulatory Visit (HOSPITAL_COMMUNITY): Payer: Self-pay

## 2023-07-30 ENCOUNTER — Other Ambulatory Visit (HOSPITAL_COMMUNITY): Payer: Self-pay

## 2023-08-09 ENCOUNTER — Encounter: Payer: Self-pay | Admitting: Internal Medicine

## 2023-08-16 ENCOUNTER — Ambulatory Visit (INDEPENDENT_AMBULATORY_CARE_PROVIDER_SITE_OTHER): Payer: 59 | Admitting: Student

## 2023-08-16 ENCOUNTER — Other Ambulatory Visit (HOSPITAL_COMMUNITY): Payer: Self-pay

## 2023-08-16 VITALS — BP 125/86 | HR 79 | Temp 98.2°F | Ht 65.0 in | Wt 178.0 lb

## 2023-08-16 DIAGNOSIS — I1A0 Resistant hypertension: Secondary | ICD-10-CM | POA: Diagnosis not present

## 2023-08-16 DIAGNOSIS — Z7985 Long-term (current) use of injectable non-insulin antidiabetic drugs: Secondary | ICD-10-CM

## 2023-08-16 DIAGNOSIS — Z6829 Body mass index (BMI) 29.0-29.9, adult: Secondary | ICD-10-CM | POA: Diagnosis not present

## 2023-08-16 DIAGNOSIS — E669 Obesity, unspecified: Secondary | ICD-10-CM | POA: Diagnosis not present

## 2023-08-16 DIAGNOSIS — Z7984 Long term (current) use of oral hypoglycemic drugs: Secondary | ICD-10-CM

## 2023-08-16 DIAGNOSIS — E1169 Type 2 diabetes mellitus with other specified complication: Secondary | ICD-10-CM

## 2023-08-16 MED ORDER — METFORMIN HCL 1000 MG PO TABS
1000.0000 mg | ORAL_TABLET | Freq: Two times a day (BID) | ORAL | 3 refills | Status: DC
Start: 2023-08-16 — End: 2024-08-08
  Filled 2023-08-16: qty 180, 90d supply, fill #0
  Filled 2023-11-10: qty 180, 90d supply, fill #1
  Filled 2024-02-07: qty 180, 90d supply, fill #2
  Filled 2024-05-09: qty 180, 90d supply, fill #3

## 2023-08-16 MED ORDER — DEXCOM G7 SENSOR MISC
3 refills | Status: DC
  Filled 2023-08-16: qty 1, 10d supply, fill #0
  Filled 2023-08-21 (×3): qty 3, 30d supply, fill #1

## 2023-08-16 NOTE — Progress Notes (Signed)
CC: Routine Follow Up   HPI:  Sabrina Mclaughlin is a 42 y.o. 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 Please see assessment and plan below for further details.  Past Medical History:  Diagnosis Date   History of DVT (deep vein thrombosis)    Hypertension    Lymphadenitis 08/11/2021   Obesity (BMI 30-39.9)    Palpitations    Personal history of pulmonary embolism    Venous insufficiency     Current Outpatient Medications  Medication Instructions   acetaminophen (TYLENOL) 325 mg, Oral, Every 4 hours PRN   amLODipine (NORVASC) 5 mg, Oral, Daily   Blood Glucose Monitoring Suppl (ACCU-CHEK GUIDE) w/Device KIT Please check your blood sugars daily before you eat breakfast.   carvedilol (COREG) 6.25 mg, Oral, 2 times daily with meals   chlorthalidone (HYGROTON) 25 mg, Oral, Daily, Please call our office to schedule an overdue appointment with Dr. Cristal Deer before anymore refills. [phone].   Continuous Glucose Sensor (DEXCOM G7 SENSOR) MISC Apply 1 sensor as instructed on the inbox instructions every 10 days.   Eliquis 5 mg, Oral, 2 times daily   glucose blood test strip Use as instructed   Insulin Pen Needle 32G X 4 MM MISC Use to inject as directed about once daily. Change needles with every use,   losartan (COZAAR) 100 mg, Oral, Daily   metFORMIN (GLUCOPHAGE) 1,000 mg, Oral, 2 times daily with meals   Mounjaro 5 mg, Subcutaneous, Weekly   Multiple Vitamins-Minerals (THRIVE FOR LIFE WOMENS) TABS 2 tablets, Oral, Daily, By Level   rosuvastatin (CRESTOR) 5 mg, Oral, Daily   Semglee (yfgn) 15 Units, Subcutaneous, Daily   spironolactone (ALDACTONE) 25 mg, Oral, Daily     Review of Systems:   Pertinent items noted in HPI and/or A&P.  Physical Exam:  Vitals:   08/16/23 0854  BP: 125/86  Pulse: 79  Temp: 98.2 F (36.8 C)  TempSrc: Oral  SpO2:  100%  Weight: 178 lb (80.7 kg)  Height: 5\' 5"  (1.651 m)    Constitutional: Well-appearing adult female. In no acute distress. HEENT: Normocephalic, atraumatic, Sclera non-icteric, PERRL, EOM intact Cardio:Regular rate and rhythm. 2+ bilateral radial and dorsalis pedis  pulses. Pulm:Clear to auscultation bilaterally. Normal work of breathing on room air. Abdomen: Soft, non-tender, non-distended, positive bowel sounds. MSK: Trace pitting edema in the lower extremities bilaterally Skin:Warm and dry. Neuro:Alert and oriented x3. No focal deficit noted. Psych:Pleasant mood and affect.   Assessment & Plan:   Resistant hypertension Blood pressure well-controlled today 125/86.  At her last visit she was having some increased lower extremity edema and her amlodipine was decreased from 10 mg to 5 mg.  Since then this edema has improved but she is still having mild bilateral lower extremity edema that is complicated by venous insufficiency.  She is also on carvedilol 6.25 mg twice daily, chlorthalidone 25 mg daily, losartan 100 mg daily, and spironolactone 25 mg daily.  Due to her history of resistant hypertension I would not make any changes today but possibly we could trial off amlodipine in the future while increasing one of her other medications.  Last BMP 07/01/2023. - Continue amlodipine 5 mg daily, carvedilol 6.25 mg twice daily, chlorthalidone 25 mg daily, losartan 100 mg daily, and spironolactone 25 mg daily - Repeat BMP at next visit - Consider trial off amlodipine with home blood pressure checks if  lower extremity edema is bothersome  Type 2 diabetes mellitus with obesity (HCC) Patient returns after visit on 07/18/2023 with medication changes of increased tirzepatide to 5 mg weekly which she has taken 2 doses of and metformin 1000 mg twice daily.  Previously she was having polyuria and polydipsia but this is significantly improved.  She has brought in her fasting glucose log which shows a low  of 77 and a high of 118 since increasing her tirzepatide.  She has had some moderate GI regurgitation and decreased appetite which are worst for the first 1 to 2 days after taking the tirzepatide.  She is not bothered by this enough to change the medication.  Discussed increasing to 7.5 mg after at least 4 weeks on the 5 mg and she will do so at her discretion, instructed to call the clinic if she would like to increase and we will send in a new prescription without a visit.  She is also lost a significant amount of weight and is down from 14 pounds since her last visit.  She is happy with her current regimen but would like to get off insulin if possible. Discussed CGM since she is using insulin and checking her glucose at least once a day with a glucometer.  She is interested in a CGM and we will send in Dexcom today.  She was shown videos of how to apply this and she is confident that she can do it at home but knows that she can contact her diabetes coordinator if she needs any help. - Continue tirzepatide 5 mg weekly, can increase to 7.5 mg weekly (will call the clinic) - Continue metformin 1000 mg twice daily and Lantus 15 units daily - Dexcom G7 CGM - Follow-up in 2 months for repeat A1c    Patient discussed with Dr. Duwayne Heck, DO [facility] Resident PGY-2 Clinic Phone: [phone] Pager: [phone]

## 2023-08-16 NOTE — Patient Instructions (Addendum)
  Thank you, Sabrina Mclaughlin, for allowing Sabrina Mclaughlin to provide your care today.  You are doing a fantastic job managing your blood sugars and I am glad that you are also losing weight.  As we discussed please let Sabrina Mclaughlin know if any of the side effects from the medications are bothersome enough to change the medication.  We do have other options if needed.  I have sent in metformin 1000 mg tablets that you can continue to take twice a day.  If you are feeling comfortable on 5 mg of Mounjaro we can increase to 7.5 mg weekly after you have taken at least 4 weeks of the 5 mg.  If you are worried about worsening side effects we can wait until your next visit to discuss that further but if you want to increase just call our clinic and we can send in the new prescription.  I will also send in a prescription for a Dexcom G7 continuous glucose monitor and will message our diabetes coordinator to follow-up with you in case you need any help getting started with it.  We will plan to see you again in 2-3 months and we will recheck your A1c at that time.  Please let Sabrina Mclaughlin know if you need anything before then.   Follow up: 2-3 months    Remember:     Should you have any questions or concerns please call the internal medicine clinic at [phone].     Rocky Morel, DO [facility]

## 2023-08-16 NOTE — Assessment & Plan Note (Signed)
Blood pressure well-controlled today 125/86.  At her last visit she was having some increased lower extremity edema and her amlodipine was decreased from 10 mg to 5 mg.  Since then this edema has improved but she is still having mild bilateral lower extremity edema that is complicated by venous insufficiency.  She is also on carvedilol 6.25 mg twice daily, chlorthalidone 25 mg daily, losartan 100 mg daily, and spironolactone 25 mg daily.  Due to her history of resistant hypertension I would not make any changes today but possibly we could trial off amlodipine in the future while increasing one of her other medications.  Last BMP 07/01/2023. - Continue amlodipine 5 mg daily, carvedilol 6.25 mg twice daily, chlorthalidone 25 mg daily, losartan 100 mg daily, and spironolactone 25 mg daily - Repeat BMP at next visit - Consider trial off amlodipine with home blood pressure checks if lower extremity edema is bothersome

## 2023-08-16 NOTE — Assessment & Plan Note (Signed)
Patient returns after visit on 07/18/2023 with medication changes of increased tirzepatide to 5 mg weekly which she has taken 2 doses of and metformin 1000 mg twice daily.  Previously she was having polyuria and polydipsia but this is significantly improved.  She has brought in her fasting glucose log which shows a low of 77 and a high of 118 since increasing her tirzepatide.  She has had some moderate GI regurgitation and decreased appetite which are worst for the first 1 to 2 days after taking the tirzepatide.  She is not bothered by this enough to change the medication.  Discussed increasing to 7.5 mg after at least 4 weeks on the 5 mg and she will do so at her discretion, instructed to call the clinic if she would like to increase and we will send in a new prescription without a visit.  She is also lost a significant amount of weight and is down from 14 pounds since her last visit.  She is happy with her current regimen but would like to get off insulin if possible. Discussed CGM since she is using insulin and checking her glucose at least once a day with a glucometer.  She is interested in a CGM and we will send in Dexcom today.  She was shown videos of how to apply this and she is confident that she can do it at home but knows that she can contact her diabetes coordinator if she needs any help. - Continue tirzepatide 5 mg weekly, can increase to 7.5 mg weekly (will call the clinic) - Continue metformin 1000 mg twice daily and Lantus 15 units daily - Dexcom G7 CGM - Follow-up in 2 months for repeat A1c

## 2023-08-21 ENCOUNTER — Other Ambulatory Visit (HOSPITAL_COMMUNITY): Payer: Self-pay

## 2023-08-23 NOTE — Progress Notes (Signed)
Internal Medicine Clinic Attending  Case discussed with the resident at the time of the visit.  We reviewed the resident's history and exam and pertinent patient test results.  I agree with the assessment, diagnosis, and plan of care documented in the resident's note.  

## 2023-08-24 ENCOUNTER — Other Ambulatory Visit (HOSPITAL_COMMUNITY): Payer: Self-pay

## 2023-08-27 ENCOUNTER — Other Ambulatory Visit: Payer: Self-pay

## 2023-08-27 ENCOUNTER — Other Ambulatory Visit (HOSPITAL_COMMUNITY): Payer: Self-pay

## 2023-09-09 ENCOUNTER — Other Ambulatory Visit: Payer: Self-pay

## 2023-09-10 IMAGING — US US THYROID
1 series · 13 of 25 positions shown · non-contrast
Comparison: None.

CLINICAL DATA: Thyroid nodule

EXAM:
THYROID ULTRASOUND
TECHNIQUE: Ultrasound examination of the thyroid gland and adjacent soft
tissues was performed.

[Series 1: us thyroid · 73 acquisitions, 13 frames shown]
[im 1/73]
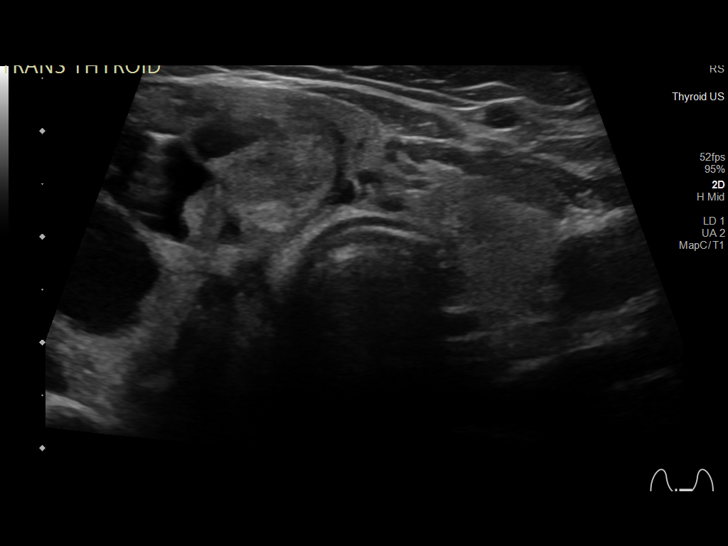
[im 7/73]
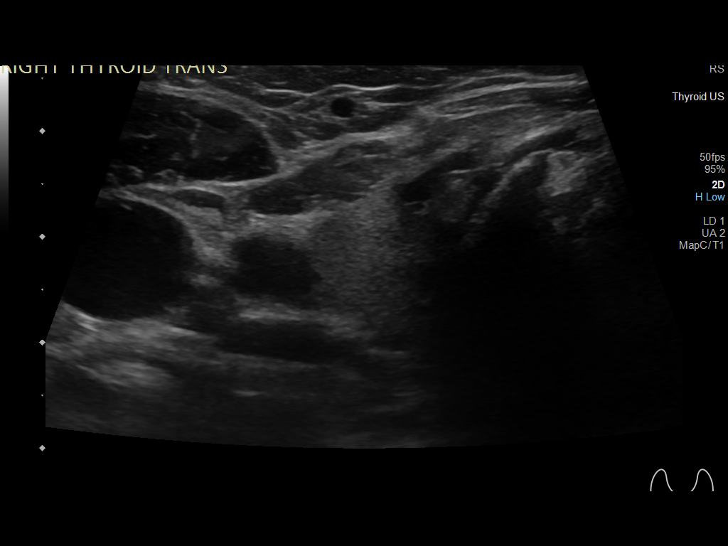
[im 13/73]
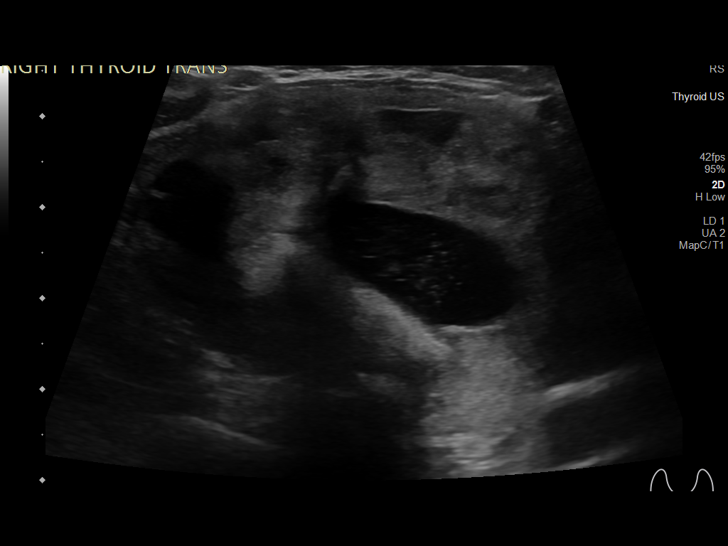
[im 19/73]
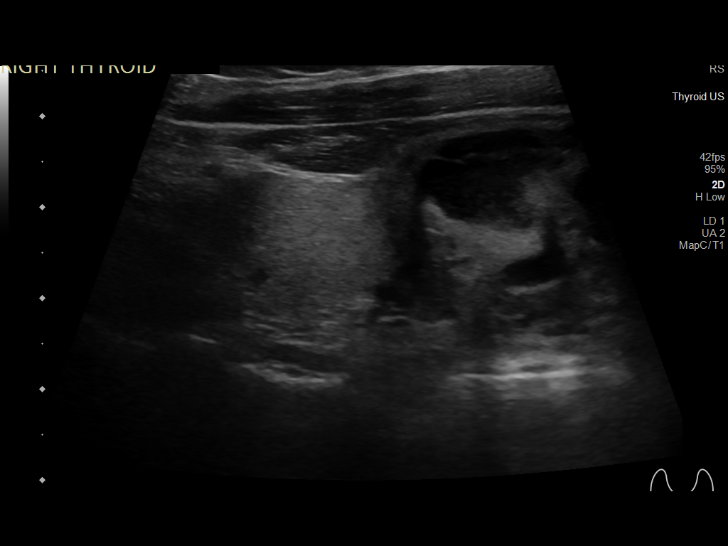
[im 25/73]
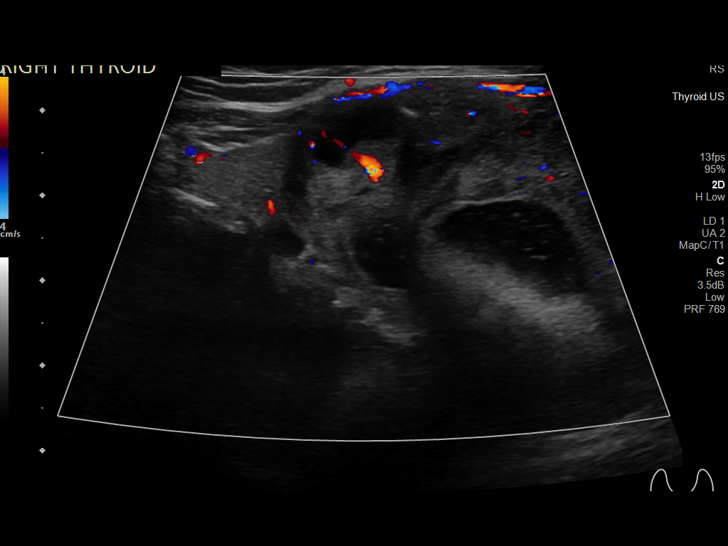
[im 31/73]
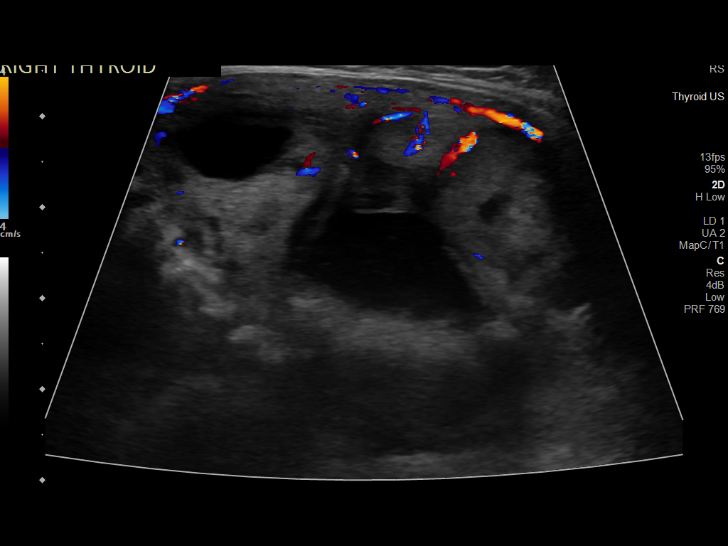
[im 37/73]
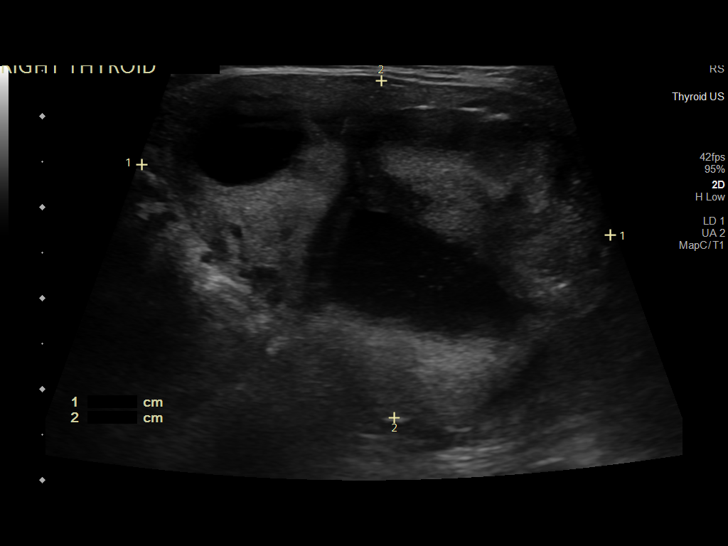
[im 43/73]
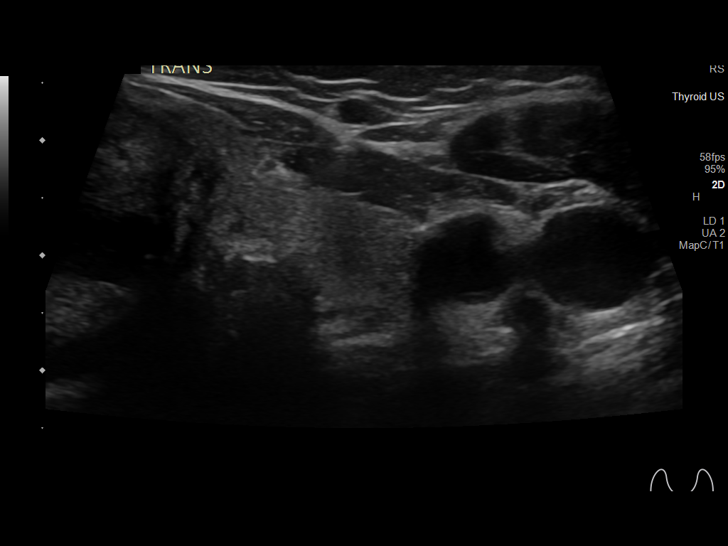
[im 49/73]
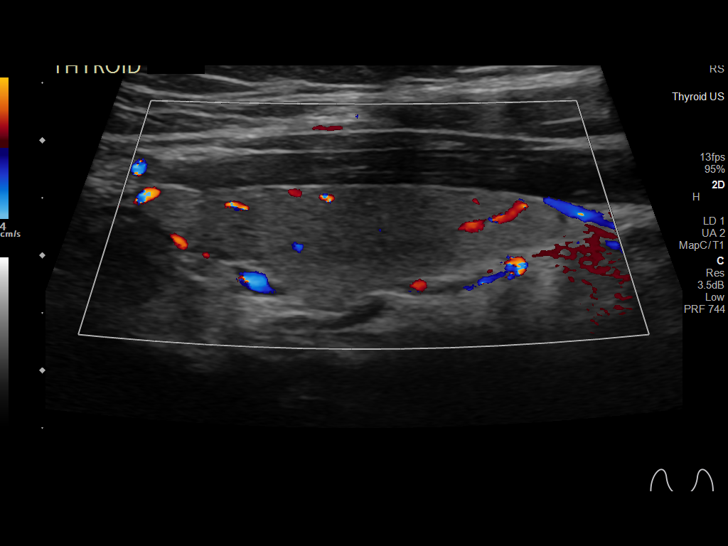
[im 55/73]
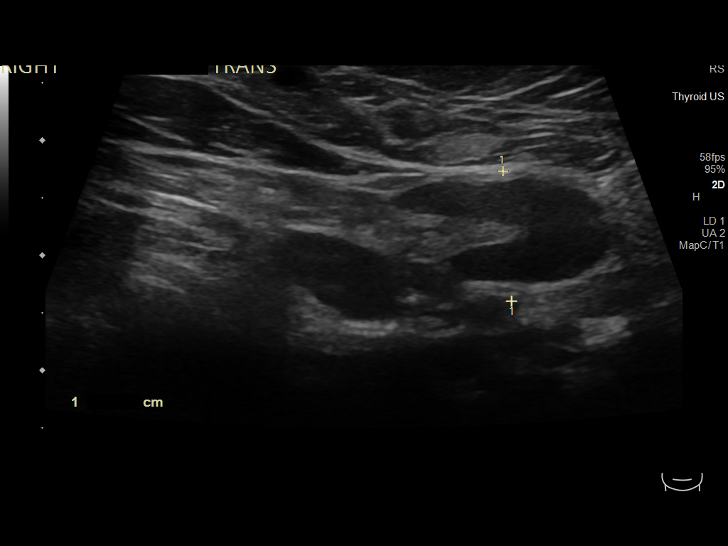
[im 61/73]
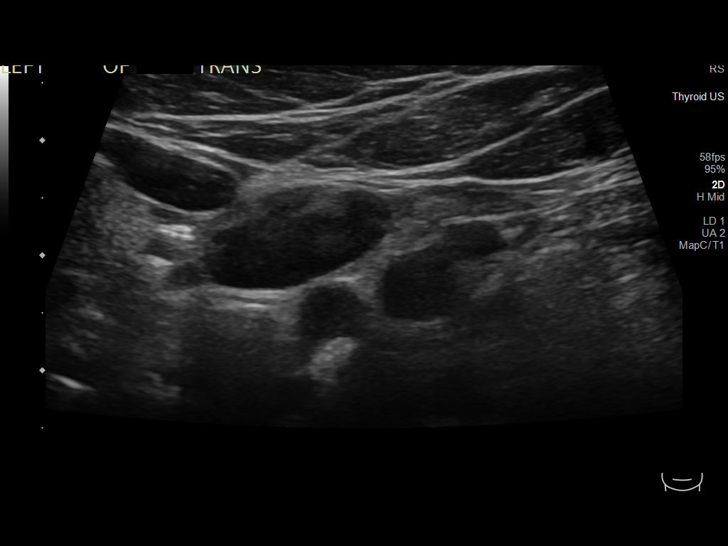
[im 67/73]
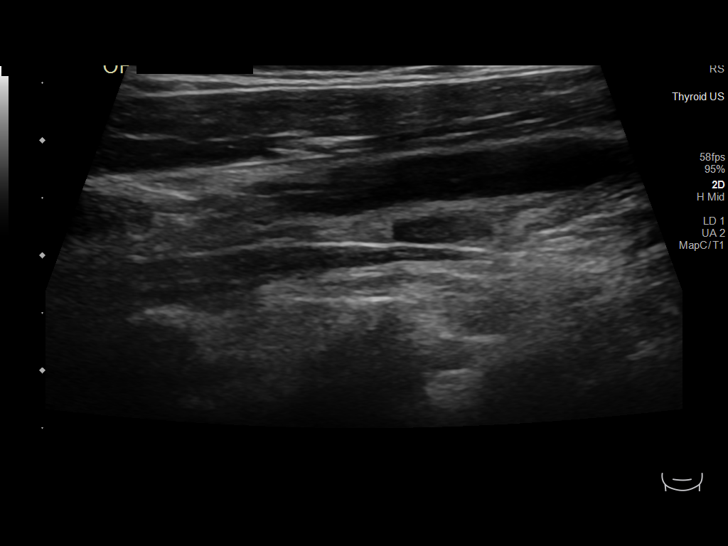
[im 73/73]
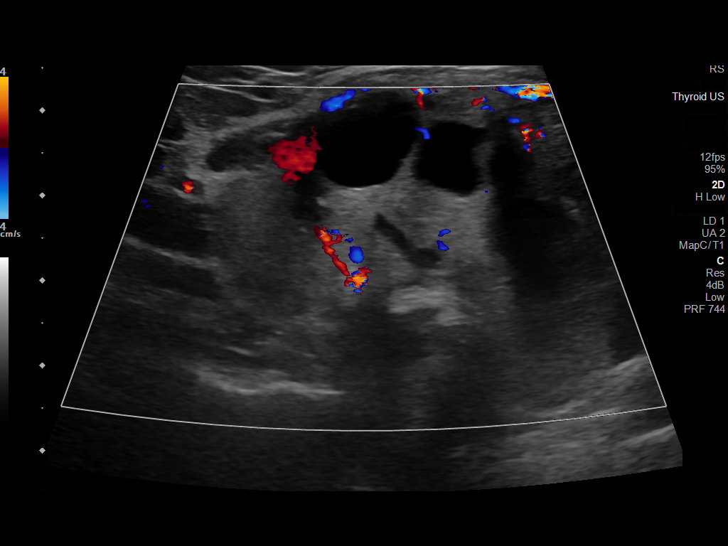

[13 of 25 positions shown; findings below may reference images not displayed]

FINDINGS: Parenchymal Echotexture: Mildly heterogeneous

Isthmus: 0.9 cm

Right lobe: 7.0 x 3.8 x 5.0 cm

Left lobe: 3.71.1 x 1 1 cm

_________________________________________________________

Estimated total number of nodules >/= 1 cm: 1

Number of spongiform nodules >/=  2 cm not described below (TR1): 0

Number of mixed cystic and solid nodules >/= 1.5 cm not described
below (TR2): 0

_________________________________________________________

5.2 x 4.3 x 3.7 cm mixed solid cystic isoechoic nodule in the right
thyroid lobe does not meet criteria for FNA or imaging surveillance.

Mildly prominent bilateral neck lymph nodes measuring up to 0.9 cm
in short axis on the right and 1.0 cm on the left.
IMPRESSION: 1. Large mixed solid cystic right thyroid nodule does not meet
criteria for FNA or imaging surveillance. If this nodule is causing
significant symptoms for the patient, fluid aspiration can be
performed.
2. Borderline enlarged bilateral neck lymph nodes. Follow-up
ultrasound should be performed in 8-12 weeks to again evaluate these
lymph nodes.
3. These results will be called to the ordering clinician or
representative by the Radiologist Assistant, and communication
documented in the PACS or [REDACTED].

The above is in keeping with the ACR TI-RADS recommendations - [HOSPITAL] 7694;[DATE].

## 2023-09-20 ENCOUNTER — Other Ambulatory Visit: Payer: Self-pay | Admitting: Student

## 2023-09-20 DIAGNOSIS — E669 Obesity, unspecified: Secondary | ICD-10-CM

## 2023-09-23 ENCOUNTER — Other Ambulatory Visit (HOSPITAL_COMMUNITY): Payer: Self-pay

## 2023-09-23 ENCOUNTER — Other Ambulatory Visit: Payer: Self-pay

## 2023-09-23 MED ORDER — DEXCOM G7 SENSOR MISC
3 refills | Status: DC
  Filled 2023-09-23: qty 3, 30d supply, fill #0
  Filled 2023-10-12: qty 3, 30d supply, fill #1

## 2023-10-01 ENCOUNTER — Other Ambulatory Visit (HOSPITAL_COMMUNITY): Payer: Self-pay

## 2023-10-01 ENCOUNTER — Other Ambulatory Visit: Payer: Self-pay

## 2023-10-07 ENCOUNTER — Other Ambulatory Visit: Payer: Self-pay

## 2023-10-14 ENCOUNTER — Other Ambulatory Visit: Payer: Self-pay

## 2023-10-18 ENCOUNTER — Encounter: Payer: Self-pay | Admitting: Student

## 2023-10-18 ENCOUNTER — Ambulatory Visit (INDEPENDENT_AMBULATORY_CARE_PROVIDER_SITE_OTHER): Payer: 59 | Admitting: Student

## 2023-10-18 ENCOUNTER — Other Ambulatory Visit (HOSPITAL_COMMUNITY): Payer: Self-pay

## 2023-10-18 VITALS — BP 125/92 | HR 76 | Temp 97.6°F | Ht 65.0 in | Wt 162.7 lb

## 2023-10-18 DIAGNOSIS — E1169 Type 2 diabetes mellitus with other specified complication: Secondary | ICD-10-CM | POA: Diagnosis not present

## 2023-10-18 DIAGNOSIS — Z6827 Body mass index (BMI) 27.0-27.9, adult: Secondary | ICD-10-CM | POA: Diagnosis not present

## 2023-10-18 DIAGNOSIS — Z7985 Long-term (current) use of injectable non-insulin antidiabetic drugs: Secondary | ICD-10-CM | POA: Diagnosis not present

## 2023-10-18 DIAGNOSIS — Z794 Long term (current) use of insulin: Secondary | ICD-10-CM

## 2023-10-18 DIAGNOSIS — Z0000 Encounter for general adult medical examination without abnormal findings: Secondary | ICD-10-CM

## 2023-10-18 DIAGNOSIS — I2609 Other pulmonary embolism with acute cor pulmonale: Secondary | ICD-10-CM

## 2023-10-18 DIAGNOSIS — Z1231 Encounter for screening mammogram for malignant neoplasm of breast: Secondary | ICD-10-CM

## 2023-10-18 DIAGNOSIS — I159 Secondary hypertension, unspecified: Secondary | ICD-10-CM

## 2023-10-18 DIAGNOSIS — E1165 Type 2 diabetes mellitus with hyperglycemia: Secondary | ICD-10-CM

## 2023-10-18 DIAGNOSIS — I1A0 Resistant hypertension: Secondary | ICD-10-CM | POA: Diagnosis not present

## 2023-10-18 DIAGNOSIS — E669 Obesity, unspecified: Secondary | ICD-10-CM

## 2023-10-18 DIAGNOSIS — I10 Essential (primary) hypertension: Secondary | ICD-10-CM

## 2023-10-18 LAB — POCT GLYCOSYLATED HEMOGLOBIN (HGB A1C): Hemoglobin A1C: 5.8 % — AB (ref 4.0–5.6)

## 2023-10-18 LAB — GLUCOSE, CAPILLARY: Glucose-Capillary: 92 mg/dL (ref 70–99)

## 2023-10-18 MED ORDER — LOSARTAN POTASSIUM 100 MG PO TABS
100.0000 mg | ORAL_TABLET | Freq: Every day | ORAL | 3 refills | Status: DC
  Filled 2023-10-18 – 2023-11-03 (×2): qty 90, 90d supply, fill #0
  Filled 2024-02-01: qty 90, 90d supply, fill #1
  Filled 2024-04-29: qty 90, 90d supply, fill #2
  Filled 2024-07-29: qty 90, 90d supply, fill #3

## 2023-10-18 MED ORDER — CHLORTHALIDONE 25 MG PO TABS
25.0000 mg | ORAL_TABLET | Freq: Every day | ORAL | 3 refills | Status: DC
  Filled 2023-10-18: qty 90, 90d supply, fill #0
  Filled 2024-01-15: qty 90, 90d supply, fill #1
  Filled 2024-04-29: qty 90, 90d supply, fill #2
  Filled 2024-07-29: qty 90, 90d supply, fill #3

## 2023-10-18 MED ORDER — CARVEDILOL 6.25 MG PO TABS
6.2500 mg | ORAL_TABLET | Freq: Two times a day (BID) | ORAL | 3 refills | Status: DC
  Filled 2023-10-18: qty 180, 90d supply, fill #0
  Filled 2024-02-03: qty 180, 90d supply, fill #1
  Filled 2024-04-29: qty 180, 90d supply, fill #2
  Filled 2024-07-29: qty 180, 90d supply, fill #3

## 2023-10-18 MED ORDER — SPIRONOLACTONE 25 MG PO TABS
25.0000 mg | ORAL_TABLET | Freq: Every day | ORAL | 3 refills | Status: DC
  Filled 2023-10-18 – 2023-11-03 (×2): qty 90, 90d supply, fill #0
  Filled 2024-02-01: qty 90, 90d supply, fill #1
  Filled 2024-04-29: qty 90, 90d supply, fill #2
  Filled 2024-07-29: qty 90, 90d supply, fill #3

## 2023-10-18 MED ORDER — AMLODIPINE BESYLATE 5 MG PO TABS
5.0000 mg | ORAL_TABLET | Freq: Every day | ORAL | 3 refills | Status: DC
  Filled 2023-10-18 – 2023-11-05 (×2): qty 90, 90d supply, fill #0
  Filled 2024-02-01: qty 90, 90d supply, fill #1
  Filled 2024-04-30: qty 90, 90d supply, fill #2
  Filled 2024-07-29: qty 90, 90d supply, fill #3

## 2023-10-18 MED ORDER — ROSUVASTATIN CALCIUM 5 MG PO TABS
5.0000 mg | ORAL_TABLET | Freq: Every day | ORAL | 3 refills | Status: DC
  Filled 2023-10-18 – 2023-11-05 (×2): qty 90, 90d supply, fill #0
  Filled 2024-02-01: qty 90, 90d supply, fill #1
  Filled 2024-04-30: qty 90, 90d supply, fill #2
  Filled 2024-07-29: qty 90, 90d supply, fill #3

## 2023-10-18 NOTE — Progress Notes (Signed)
Subjective:  CC: blood pressure and diabetes follow up  HPI:  [name] is a 42 y.o. female with a past medical history stated below and presents today for T2DM and HTN follow up. Please see problem based assessment and plan for additional details.  Past Medical History:  Diagnosis Date   History of DVT (deep vein thrombosis)    Hypertension    Lymphadenitis 08/11/2021   Obesity (BMI 30-39.9)    Palpitations    Personal history of pulmonary embolism    Venous insufficiency     Current Outpatient Medications on File Prior to Visit  Medication Sig Dispense Refill   acetaminophen (TYLENOL) 325 MG tablet Take 1 tablet (325 mg total) by mouth every 4 (four) hours as needed for headache or mild pain.     apixaban (ELIQUIS) 5 MG TABS tablet Take 1 tablet (5 mg total) by mouth 2 (two) times daily. 180 tablet 3   Blood Glucose Monitoring Suppl (ACCU-CHEK GUIDE) w/Device KIT Please check your blood sugars daily before you eat breakfast. 1 kit 0   Continuous Glucose Sensor (DEXCOM G7 SENSOR) MISC Apply 1 sensor as instructed on the inbox instructions every 10 days. 3 each 3   glucose blood test strip Use as instructed 100 each 12   insulin glargine-yfgn (SEMGLEE, YFGN,) 100 UNIT/ML Pen Inject 15 Units into the skin daily. 15 mL 11   Insulin Pen Needle 32G X 4 MM MISC Use to inject as directed about once daily. Change needles with every use, 100 each 2   metFORMIN (GLUCOPHAGE) 1000 MG tablet Take 1 tablet (1,000 mg total) by mouth 2 (two) times daily with a meal. 180 tablet 3   Multiple Vitamins-Minerals (THRIVE FOR LIFE WOMENS) TABS Take 2 tablets by mouth daily. By Level     tirzepatide Greggory Keen) 5 MG/0.5ML Pen Inject 5 mg into the skin once a week. 2 mL 3   No current facility-administered medications on file prior to visit.    Family History  Problem Relation Age of Onset   Diabetes Mother    Heart attack Father 51   Heart attack Paternal Grandfather     Social History    Socioeconomic History   Marital status: Single    Spouse name: Not on file   Number of children: Not on file   Years of education: Not on file   Highest education level: Not on file  Occupational History   Not on file  Tobacco Use   Smoking status: Never    Passive exposure: Never   Smokeless tobacco: Never  Substance and Sexual Activity   Alcohol use: No   Drug use: No   Sexual activity: Not on file  Other Topics Concern   Not on file  Social History Narrative   Not on file   Social Determinants of Health   Financial Resource Strain: Low Risk  (06/04/2022)   Overall Financial Resource Strain (CARDIA)    Difficulty of Paying Living Expenses: Not hard at all  Food Insecurity: No Food Insecurity (06/04/2022)   Hunger Vital Sign    Worried About Running Out of Food in the Last Year: Never true    Ran Out of Food in the Last Year: Never true  Transportation Needs: No Transportation Needs (06/04/2022)   PRAPARE - Administrator, Civil Service (Medical): No    Lack of Transportation (Non-Medical): No  Physical Activity: Inactive (06/04/2022)   Exercise Vital Sign    Days of Exercise  per Week: 0 days    Minutes of Exercise per Session: 0 min  Stress: Not on file  Social Connections: Not on file  Intimate Partner Violence: Not on file    Review of Systems: ROS negative except for what is noted on the assessment and plan.  Objective:   Vitals:   10/18/23 0850  BP: (!) 125/92  Pulse: 76  Temp: 97.6 F (36.4 C)  TempSrc: Oral  SpO2: 100%  Weight: 162 lb 11.2 oz (73.8 kg)  Height: 5\' 5"  (1.651 m)    Physical Exam: Constitutional: well-appearing woman sitting in chair, in no acute distress HENT: normocephalic atraumatic, mucous membranes moist Eyes: conjunctiva non-erythematous Neck: supple, thyromegaly stable Cardiovascular: regular rate and rhythm, no m/r/g Pulmonary/Chest: normal work of breathing on room air, lungs clear to auscultation  bilaterally Abdominal: soft, non-tender, non-distended MSK: normal bulk and tone Neurological: alert & oriented x 3, 5/5 strength in bilateral upper and lower extremities, normal gait Skin: warm and dry Psych: Pleasant mood and affect       08/16/2023    8:56 AM  Depression screen PHQ 2/9  Decreased Interest 0  Down, Depressed, Hopeless 0  PHQ - 2 Score 0        No data to display           Assessment & Plan:   Resistant hypertension Well controlled on her blood pressure log despite slightly above goal today. Asymptomatic. Last BMP in August of 2024. Patient would like to keep medication separate at this time; if this changes, medications could be combined for ease. No claudication symptoms -BMP today -Continue: Chlorthalidone 25 mg Spironolactone 25 Losartan 100 mg  Carvedilol6.26 BID Amlodpine 5  Type 2 diabetes mellitus with obesity (HCC)  Lab Results  Component Value Date   HGBA1C 5.8 (A) 10/18/2023  Improved from 10.8 this summer. No microalbuminuria. Patient has lost 50 lb on Tizerpatide 5 mg weekly. Still experiences some bloating, nausea after injection of tizerpatide that lasts about 2 days but is not significantly impacting quality of life or her work.  Adherent to statin.   No neuropathy or claudication symptoms. Finds the sensor cumbersome and not accurate and prefers to stick with glucose meter.  - will decrease insulin to 12 units daily today -Continue Tizerpatide 5 mg weekly.  -If A1c <7 at next visit, consider further down titration of insulin -Would uptitatre GLP1 RA slowly given side effects  -FU 3 months  Healthcare maintenance Referral for mammogram    Return in about 3 months (around 01/16/2024) for DM and HTN follow up.  Patient discussed with Dr. Allyn Kenner, MD [facility]  10/18/2023, 11:08 AM

## 2023-10-18 NOTE — Assessment & Plan Note (Signed)
  Lab Results  Component Value Date   HGBA1C 5.8 (A) 10/18/2023  Improved from 10.8 this summer. No microalbuminuria. Patient has lost 50 lb on Tizerpatide 5 mg weekly. Still experiences some bloating, nausea after injection of tizerpatide that lasts about 2 days but is not significantly impacting quality of life or her work.  Adherent to statin.   No neuropathy or claudication symptoms. Finds the sensor cumbersome and not accurate and prefers to stick with glucose meter.  - will decrease insulin to 12 units daily today -Continue Tizerpatide 5 mg weekly.  -If A1c <7 at next visit, consider further down titration of insulin -Would uptitatre GLP1 RA slowly given side effects  -FU 3 months

## 2023-10-18 NOTE — Patient Instructions (Addendum)
Thank you, Ms.Caileen Humphres for allowing [name] to provide your care today. Today we discussed   Blood pressure - congratulations on your excellent control. Continue on the current medications. I sent 90-day supplies as you requested. We will check your renal function and your electrolytes today.  Diabetes - congratulations! Excellent control. We will continue with the Tizerpatide at the 5 mg weekly dose but will decrease the insulin to 12 units as you continue to improve your A1c. Please continue to monitor your blood sugars through this transition  Weight management - congratulations on the big improvement. This is reflected on your overall health!   Referrals: -mammogram - call [phone] to schedule an appointment in about 2 weeks once your insurance has had a change to approve the referral  New medications: -  I have ordered the following labs for you:  Lab Orders         Glucose, capillary         BMP8+Anion Gap         POC Hbg A1C       I will call if any are abnormal. All of your labs can be accessed through "My Chart".   My Chart Access: [email or web address]  Please follow-up in: 3 months for diabetes and blood pressure follow up    We look forward to seeing you next time. Please call our clinic at [phone] if you have any questions or concerns. The best time to call is Monday-Friday from 9am-4pm, but there is someone available 24/7. If after hours or the weekend, call the main hospital number and ask for the Internal Medicine Resident On-Call. If you need medication refills, please notify your pharmacy one week in advance and they will send [name] a request.   Thank you for letting us take part in your care. Wishing you the best!  Morene Crocker, MD 10/18/2023, 9:15 AM [facility]

## 2023-10-18 NOTE — Assessment & Plan Note (Signed)
Referral for mammogram

## 2023-10-18 NOTE — Assessment & Plan Note (Signed)
Well controlled on her blood pressure log despite slightly above goal today. Asymptomatic. Last BMP in August of 2024. Patient would like to keep medication separate at this time; if this changes, medications could be combined for ease. No claudication symptoms -BMP today -Continue: Chlorthalidone 25 mg Spironolactone 25 Losartan 100 mg  Carvedilol6.26 BID Amlodpine 5

## 2023-10-19 LAB — BMP8+ANION GAP
Anion Gap: 17 mmol/L (ref 10.0–18.0)
BUN/Creatinine Ratio: 17 (ref 9–23)
BUN: 15 mg/dL (ref 6–24)
CO2: 21 mmol/L (ref 20–29)
Calcium: 10.1 mg/dL (ref 8.7–10.2)
Chloride: 102 mmol/L (ref 96–106)
Creatinine, Ser: 0.86 mg/dL (ref 0.57–1.00)
Glucose: 92 mg/dL (ref 70–99)
Potassium: 4.6 mmol/L (ref 3.5–5.2)
Sodium: 140 mmol/L (ref 134–144)
eGFR: 85 mL/min/{1.73_m2} (ref >59)

## 2023-10-22 ENCOUNTER — Other Ambulatory Visit (HOSPITAL_COMMUNITY): Payer: Self-pay

## 2023-10-22 NOTE — Progress Notes (Signed)
Internal Medicine Clinic Attending  Case discussed with the resident at the time of the visit.  We reviewed the resident's history and exam and pertinent patient test results.  I agree with the assessment, diagnosis, and plan of care documented in the resident's note.  

## 2023-10-23 ENCOUNTER — Other Ambulatory Visit (HOSPITAL_COMMUNITY): Payer: Self-pay

## 2023-10-29 NOTE — Progress Notes (Signed)
Significant improvement in A1c. Electrolytes within normal levels. No changes to current medications based on these labs.

## 2023-11-03 ENCOUNTER — Other Ambulatory Visit: Payer: Self-pay | Admitting: Internal Medicine

## 2023-11-03 DIAGNOSIS — I159 Secondary hypertension, unspecified: Secondary | ICD-10-CM

## 2023-11-03 DIAGNOSIS — I2609 Other pulmonary embolism with acute cor pulmonale: Secondary | ICD-10-CM

## 2023-11-04 ENCOUNTER — Other Ambulatory Visit (HOSPITAL_COMMUNITY): Payer: Self-pay

## 2023-11-04 ENCOUNTER — Other Ambulatory Visit: Payer: Self-pay

## 2023-11-05 ENCOUNTER — Other Ambulatory Visit (HOSPITAL_COMMUNITY): Payer: Self-pay

## 2023-11-05 ENCOUNTER — Other Ambulatory Visit: Payer: Self-pay

## 2023-11-07 ENCOUNTER — Other Ambulatory Visit: Payer: Self-pay

## 2023-11-09 ENCOUNTER — Other Ambulatory Visit (HOSPITAL_COMMUNITY): Payer: Self-pay

## 2023-11-09 MED ORDER — APIXABAN 5 MG PO TABS
5.0000 mg | ORAL_TABLET | Freq: Two times a day (BID) | ORAL | 3 refills | Status: DC
  Filled 2023-11-09 – 2023-11-11 (×2): qty 180, 90d supply, fill #0
  Filled 2024-02-07: qty 180, 90d supply, fill #1
  Filled 2024-05-09 – 2024-05-13 (×3): qty 180, 90d supply, fill #2
  Filled 2024-08-08: qty 180, 90d supply, fill #3

## 2023-11-11 ENCOUNTER — Other Ambulatory Visit (HOSPITAL_BASED_OUTPATIENT_CLINIC_OR_DEPARTMENT_OTHER): Payer: Self-pay

## 2023-11-11 ENCOUNTER — Other Ambulatory Visit: Payer: Self-pay

## 2023-11-20 ENCOUNTER — Other Ambulatory Visit: Payer: Self-pay | Admitting: Student

## 2023-11-20 DIAGNOSIS — E1165 Type 2 diabetes mellitus with hyperglycemia: Secondary | ICD-10-CM

## 2023-11-21 ENCOUNTER — Other Ambulatory Visit (HOSPITAL_COMMUNITY): Payer: Self-pay

## 2023-11-21 ENCOUNTER — Encounter (HOSPITAL_COMMUNITY): Payer: Self-pay

## 2023-11-21 MED ORDER — MOUNJARO 5 MG/0.5ML ~~LOC~~ SOAJ
5.0000 mg | SUBCUTANEOUS | 3 refills | Status: DC
  Filled 2023-11-21: qty 2, 28d supply, fill #0
  Filled 2023-12-18 – 2023-12-20 (×2): qty 2, 28d supply, fill #1
  Filled 2024-01-15: qty 2, 28d supply, fill #2

## 2023-11-22 ENCOUNTER — Other Ambulatory Visit (HOSPITAL_COMMUNITY): Payer: Self-pay

## 2023-11-22 MED ORDER — PENICILLIN V POTASSIUM 500 MG PO TABS
ORAL_TABLET | ORAL | 0 refills | Status: DC
  Filled 2023-11-22: qty 28, 7d supply, fill #0

## 2023-11-22 MED ORDER — IBUPROFEN 800 MG PO TABS
ORAL_TABLET | ORAL | 0 refills | Status: DC
  Filled 2023-11-22: qty 20, 5d supply, fill #0

## 2023-11-22 MED ORDER — CHLORHEXIDINE GLUCONATE 0.12 % MT SOLN
OROMUCOSAL | 0 refills | Status: DC
  Filled 2023-11-22: qty 473, 14d supply, fill #0

## 2023-12-10 ENCOUNTER — Other Ambulatory Visit (HOSPITAL_COMMUNITY): Payer: Self-pay

## 2023-12-20 ENCOUNTER — Other Ambulatory Visit: Payer: Self-pay

## 2023-12-22 ENCOUNTER — Other Ambulatory Visit: Payer: Self-pay

## 2024-01-08 ENCOUNTER — Other Ambulatory Visit (HOSPITAL_COMMUNITY): Payer: Self-pay

## 2024-01-15 ENCOUNTER — Other Ambulatory Visit: Payer: Self-pay

## 2024-01-15 ENCOUNTER — Other Ambulatory Visit (HOSPITAL_COMMUNITY): Payer: Self-pay

## 2024-01-27 ENCOUNTER — Telehealth: Payer: Self-pay | Admitting: *Deleted

## 2024-01-27 ENCOUNTER — Ambulatory Visit (INDEPENDENT_AMBULATORY_CARE_PROVIDER_SITE_OTHER): Payer: 59 | Admitting: Internal Medicine

## 2024-01-27 ENCOUNTER — Encounter: Payer: Self-pay | Admitting: Internal Medicine

## 2024-01-27 ENCOUNTER — Other Ambulatory Visit (HOSPITAL_COMMUNITY): Payer: Self-pay

## 2024-01-27 VITALS — BP 130/79 | HR 78 | Temp 97.7°F | Ht 65.0 in | Wt 147.2 lb

## 2024-01-27 DIAGNOSIS — Z7985 Long-term (current) use of injectable non-insulin antidiabetic drugs: Secondary | ICD-10-CM | POA: Diagnosis not present

## 2024-01-27 DIAGNOSIS — Z0000 Encounter for general adult medical examination without abnormal findings: Secondary | ICD-10-CM

## 2024-01-27 DIAGNOSIS — Z6824 Body mass index (BMI) 24.0-24.9, adult: Secondary | ICD-10-CM

## 2024-01-27 DIAGNOSIS — E669 Obesity, unspecified: Secondary | ICD-10-CM

## 2024-01-27 DIAGNOSIS — Z1231 Encounter for screening mammogram for malignant neoplasm of breast: Secondary | ICD-10-CM

## 2024-01-27 DIAGNOSIS — I1A0 Resistant hypertension: Secondary | ICD-10-CM | POA: Diagnosis not present

## 2024-01-27 DIAGNOSIS — E1169 Type 2 diabetes mellitus with other specified complication: Secondary | ICD-10-CM

## 2024-01-27 DIAGNOSIS — Z7984 Long term (current) use of oral hypoglycemic drugs: Secondary | ICD-10-CM

## 2024-01-27 DIAGNOSIS — Z1211 Encounter for screening for malignant neoplasm of colon: Secondary | ICD-10-CM

## 2024-01-27 DIAGNOSIS — E1165 Type 2 diabetes mellitus with hyperglycemia: Secondary | ICD-10-CM

## 2024-01-27 LAB — GLUCOSE, CAPILLARY: Glucose-Capillary: 103 mg/dL — ABNORMAL HIGH (ref 70–99)

## 2024-01-27 LAB — POCT GLYCOSYLATED HEMOGLOBIN (HGB A1C): Hemoglobin A1C: 4.9 % (ref 4.0–5.6)

## 2024-01-27 MED ORDER — MOUNJARO 7.5 MG/0.5ML ~~LOC~~ SOAJ
7.5000 mg | SUBCUTANEOUS | 3 refills | Status: DC
  Filled 2024-01-27 – 2024-02-01 (×2): qty 2, 28d supply, fill #0

## 2024-01-27 NOTE — Telephone Encounter (Signed)
 Mammogram appointment April 4.2025 @ 8:20 am arrive 8:00 am/ appointment mailed to patient.

## 2024-01-27 NOTE — Assessment & Plan Note (Signed)
 Referral for mammogram.  She is at average risk for colon cancer with no family history.  Will order Cologuard testing.  I talked with her that if this was positive she would have to proceed to colonoscopy.  If negative she will complete testing every 3 years.

## 2024-01-27 NOTE — Progress Notes (Signed)
 Subjective:  CC: diabetes   HPI:  [name] is a 43 y.o. 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  She was diagnosed with diabetes in August and started on insulin at that time his A1c was at 10.  She was also started on Mounjaro and metformin at that time.  A1c improved in December at 5.8.  She has lost around 50 pounds since August and 70 pounds in the last year.  Checking her blood sugars twice a day as well as her blood pressures at least daily.  She denies symptoms of hypoglycemia or hypotension.  Please see problem based assessment and plan for additional details.  Past Medical History:  Diagnosis Date   Diabetes (HCC)    History of DVT (deep vein thrombosis)    Hypertension    Lymphadenitis 08/11/2021   Obesity (BMI 30-39.9)    Palpitations    Personal history of pulmonary embolism    Venous insufficiency     MEDICATIONS:  Losartan 100 mg qd Carvedilol 6.25 mg BID Amlodipine 5mg  Chlorthalidone 25 mg Spironolactone 25 mg Rosuvastatin 5 mg Tirzepatide 5 mg Glargine 12 units Metformin 1000 mg bid Eliquis 5 mg bid  Family History  Problem Relation Age of Onset   Diabetes Mother    Heart attack Father 81   Heart attack Paternal Grandfather    Social History   Socioeconomic History   Marital status: Single    Spouse name: Not on file   Number of children: Not on file   Years of education: Not on file   Highest education level: Not on file  Occupational History   Not on file  Tobacco Use   Smoking status: Never    Passive exposure: Never   Smokeless tobacco: Never  Substance and Sexual Activity   Alcohol use: No   Drug use: No   Sexual activity: Not on file  Other Topics Concern   Not on file  Social History Narrative   Not on file   Social Drivers of Health   Financial Resource Strain: Low Risk  (06/04/2022)   Overall Financial  Resource Strain (CARDIA)    Difficulty of Paying Living Expenses: Not hard at all  Food Insecurity: No Food Insecurity (06/04/2022)   Hunger Vital Sign    Worried About Running Out of Food in the Last Year: Never true    Ran Out of Food in the Last Year: Never true  Transportation Needs: No Transportation Needs (06/04/2022)   PRAPARE - Administrator, Civil Service (Medical): No    Lack of Transportation (Non-Medical): No  Physical Activity: Inactive (06/04/2022)   Exercise Vital Sign    Days of Exercise per Week: 0 days    Minutes of Exercise per Session: 0 min  Stress: Not on file  Social Connections: Not on file  Intimate Partner Violence: Not on file    Review of Systems: ROS negative except for what is noted on the assessment and plan.  Objective:   Vitals:   01/27/24 0851  BP: 130/79  Pulse: 78  Temp: 97.7 F (36.5 C)  TempSrc: Oral  SpO2: 100%  Weight: 147 lb 3.2 oz (66.8 kg)  Height: 5\' 5"  (1.651 m)    Physical Exam: Constitutional: well-appearing, in no acute distress Cardiovascular: regular rate and rhythm, no m/r/g Pulmonary/Chest: normal work of breathing on room air, lungs clear to auscultation bilaterally  Abdominal: soft, non-tender, non-distended MSK: non pitting edema to bilateral lower extremities Skin: warm and dry   Assessment & Plan:  Type 2 diabetes mellitus with obesity (HCC) Home medications include metformin 1000 mg twice daily, mounjaro 5 mg weekly and glargine 12 units nightly. Insulin was started in August of 2024 with diagnosis of diabetes (A1c 10.6%) to help bring sugars down quickly as she was having polyuria and polydipsia. She has never had DKA. A: A1c at 4.9 P: Stop glargine, I talked with patient that she could continue checking her blood sugars with there is no indication to she is no longer on medications that put her at risk for low blood sugars Increase Mounjaro from 5 mg weekly to 7.5 mg weekly Continue metformin at  1000 mg twice daily  Resistant hypertension Blood pressure is well-controlled at 130/79.  She is checking her blood sugars 1-2 times daily.  Average systolics at home between 110s to 120s with diastolic in the 70s.  She denies dizziness or other symptoms with blood pressure regimen.  Medications include amlodipine 5 mg, losartan 100 mg, chlorthalidone 25 mg, spironolactone 25 mg and carvedilol 6.25 mg twice daily  P: Her blood pressures are well-controlled with current medications.  I am hopeful that she may not need all of these medications long-term with her continued weight loss.  Would consider discontinuing beta-blocker first if she were to start having symptoms of not tolerating medications or blood pressures were low. Continue amlodipine 5 mg, losartan 100 mg, chlorthalidone 25 mg, spironolactone 25 mg and carvedilol 6.25 mg twice daily  Healthcare maintenance Referral for mammogram.  She is at average risk for colon cancer with no family history.  Will order Cologuard testing.  I talked with her that if this was positive she would have to proceed to colonoscopy.  If negative she will complete testing every 3 years.   Patient discussed with Dr. Laney Pastor Atianna Haidar, D.O. [facility]  PGY-3 Pager: [phone]  Phone: [phone] Date 01/27/2024  Time 10:01 AM

## 2024-01-27 NOTE — Assessment & Plan Note (Signed)
 Blood pressure is well-controlled at 130/79.  She is checking her blood sugars 1-2 times daily.  Average systolics at home between 110s to 120s with diastolic in the 70s.  She denies dizziness or other symptoms with blood pressure regimen.  Medications include amlodipine 5 mg, losartan 100 mg, chlorthalidone 25 mg, spironolactone 25 mg and carvedilol 6.25 mg twice daily  P: Her blood pressures are well-controlled with current medications.  I am hopeful that she may not need all of these medications long-term with her continued weight loss.  Would consider discontinuing beta-blocker first if she were to start having symptoms of not tolerating medications or blood pressures were low. Continue amlodipine 5 mg, losartan 100 mg, chlorthalidone 25 mg, spironolactone 25 mg and carvedilol 6.25 mg twice daily

## 2024-01-27 NOTE — Assessment & Plan Note (Signed)
 Home medications include metformin 1000 mg twice daily, mounjaro 5 mg weekly and glargine 12 units nightly. Insulin was started in August of 2024 with diagnosis of diabetes (A1c 10.6%) to help bring sugars down quickly as she was having polyuria and polydipsia. She has never had DKA. A: A1c at 4.9 P: Stop glargine, I talked with patient that she could continue checking her blood sugars with there is no indication to she is no longer on medications that put her at risk for low blood sugars Increase Mounjaro from 5 mg weekly to 7.5 mg weekly Continue metformin at 1000 mg twice daily

## 2024-01-27 NOTE — Patient Instructions (Addendum)
 Thank you, Sabrina Mclaughlin for allowing Sabrina Mclaughlin to provide your care today.   Diabetes You A1c is 4.9 today! You have graduated to NO INSULIN. Please stop glargine. Continue metformin and mounjaro.  The goal is to keep A1c less than 7 to decrease complications from diabetes.   Blood pressure For now I will keep your blood pressures the same. Please continue to bring home reading number in at follow-up in 3 months.   I have ordered the following labs for you:  Lab Orders         Glucose, capillary         POC Hbg A1C      Referrals ordered today:   Referral Orders  No referral(s) requested today     I have ordered the following medication/changed the following medications:   Stop the following medications: Medications Discontinued During This Encounter  Medication Reason   penicillin v potassium (VEETID) 500 MG tablet    ibuprofen (IBU) 800 MG tablet    chlorhexidine (PERIDEX) 0.12 % solution    tirzepatide (MOUNJARO) 5 MG/0.5ML Pen    Continuous Glucose Sensor (DEXCOM G7 SENSOR) MISC    insulin glargine-yfgn (SEMGLEE, YFGN,) 100 UNIT/ML Pen Discontinued by provider     Start the following medications: Meds ordered this encounter  Medications   tirzepatide (MOUNJARO) 7.5 MG/0.5ML Pen    Sig: Inject 7.5 mg into the skin once a week.    Dispense:  2 mL    Refill:  3     Follow up: 3 months   We look forward to seeing you next time. Please call our clinic at [phone] if you have any questions or concerns. The best time to call is Monday-Friday from 9am-4pm, but there is someone available 24/7. If after hours or the weekend, call the main hospital number and ask for the Internal Medicine Resident On-Call. If you need medication refills, please notify your pharmacy one week in advance and they will send Sabrina Mclaughlin a request.   Thank you for trusting me with your care. Wishing you the best!   Rudene Christians, DO [facility]

## 2024-02-01 ENCOUNTER — Other Ambulatory Visit (HOSPITAL_COMMUNITY): Payer: Self-pay

## 2024-02-05 NOTE — Progress Notes (Signed)
 Internal Medicine Clinic Attending  Case discussed with the resident at the time of the visit.  We reviewed the resident's history and exam and pertinent patient test results.  I agree with the assessment, diagnosis, and plan of care documented in the resident's note.

## 2024-02-08 ENCOUNTER — Other Ambulatory Visit (HOSPITAL_COMMUNITY): Payer: Self-pay

## 2024-02-11 ENCOUNTER — Other Ambulatory Visit: Payer: Self-pay

## 2024-02-13 ENCOUNTER — Other Ambulatory Visit: Payer: Self-pay

## 2024-02-13 ENCOUNTER — Other Ambulatory Visit (HOSPITAL_COMMUNITY): Payer: Self-pay

## 2024-02-14 ENCOUNTER — Ambulatory Visit

## 2024-02-15 DIAGNOSIS — Z1211 Encounter for screening for malignant neoplasm of colon: Secondary | ICD-10-CM | POA: Diagnosis not present

## 2024-02-20 LAB — COLOGUARD: COLOGUARD: NEGATIVE

## 2024-02-25 ENCOUNTER — Encounter: Payer: Self-pay | Admitting: Internal Medicine

## 2024-02-28 ENCOUNTER — Telehealth: Payer: Self-pay | Admitting: Internal Medicine

## 2024-02-28 DIAGNOSIS — E1169 Type 2 diabetes mellitus with other specified complication: Secondary | ICD-10-CM

## 2024-02-28 NOTE — Telephone Encounter (Signed)
 Copied from CRM [id number]. Topic: Clinical - Medication Question >> Feb 28, 2024  8:42 AM Sabrina Mclaughlin ORN wrote: Reason for CRM: patient stated that they want to des crease the dosage of  tirzepatide  (MOUNJARO ) 7.5 MG/0.5ML Pen   they want to go from 7.5 back down to 5 because it is making them have an upset stomach, diarrhea nausea and she vomited once

## 2024-03-02 MED ORDER — MOUNJARO 5 MG/0.5ML ~~LOC~~ SOAJ: 5.0000 mg | SUBCUTANEOUS | 3 refills | Status: DC

## 2024-03-02 NOTE — Telephone Encounter (Signed)
 Mounjaro 5 mg weekly sent

## 2024-03-02 NOTE — Addendum Note (Signed)
 Addended by: Bishop Bullock on: 03/02/2024 05:14 PM   Modules accepted: Orders

## 2024-03-03 ENCOUNTER — Telehealth: Payer: Self-pay

## 2024-03-03 NOTE — Telephone Encounter (Addendum)
 Sabrina Mclaughlin

## 2024-03-03 NOTE — Telephone Encounter (Signed)
 Prior Authorization for patient (Mounjaro  5MG /0.5ML auto-injectors) came through on cover my meds was submitted with last office notes and labs awaiting approval or denial.  KEY:[id number]

## 2024-03-05 ENCOUNTER — Ambulatory Visit
Admission: RE | Admit: 2024-03-05 | Discharge: 2024-03-05 | Disposition: A | Discharge status: Home / Self Care | Source: Ambulatory Visit | Attending: Internal Medicine | Admitting: Internal Medicine

## 2024-03-05 DIAGNOSIS — Z1231 Encounter for screening mammogram for malignant neoplasm of breast: Secondary | ICD-10-CM

## 2024-03-05 NOTE — Telephone Encounter (Signed)
 Aleane c Nath (Key: [id number]) Rx #: [id number] Mounjaro  5MG /0.5ML auto-injectors Form [employer] Electronic Prior Authorization Form Created Sent to Plan Plan Response Submit Clinical Questions Determination Unfavorable Message from Plan Denied   Awaiting additional information regarding the denial.

## 2024-03-10 ENCOUNTER — Other Ambulatory Visit: Payer: Self-pay | Admitting: Internal Medicine

## 2024-03-10 DIAGNOSIS — R928 Other abnormal and inconclusive findings on diagnostic imaging of breast: Secondary | ICD-10-CM

## 2024-03-12 ENCOUNTER — Telehealth: Payer: Self-pay | Admitting: *Deleted

## 2024-03-12 NOTE — Telephone Encounter (Signed)
 DX Mammogram and U/S  03-23-2024  at [facility] center / patient kept her regular mammogram.

## 2024-03-23 ENCOUNTER — Ambulatory Visit
Admission: RE | Admit: 2024-03-23 | Discharge: 2024-03-23 | Disposition: A | Discharge status: Home / Self Care | Source: Ambulatory Visit | Attending: Internal Medicine | Admitting: Internal Medicine

## 2024-03-23 DIAGNOSIS — R928 Other abnormal and inconclusive findings on diagnostic imaging of breast: Secondary | ICD-10-CM

## 2024-03-24 ENCOUNTER — Other Ambulatory Visit: Payer: Self-pay | Admitting: Internal Medicine

## 2024-03-24 DIAGNOSIS — N6489 Other specified disorders of breast: Secondary | ICD-10-CM

## 2024-04-18 ENCOUNTER — Other Ambulatory Visit (HOSPITAL_COMMUNITY): Payer: Self-pay

## 2024-04-18 ENCOUNTER — Other Ambulatory Visit: Payer: Self-pay | Admitting: Internal Medicine

## 2024-04-18 DIAGNOSIS — E1165 Type 2 diabetes mellitus with hyperglycemia: Secondary | ICD-10-CM

## 2024-04-20 ENCOUNTER — Other Ambulatory Visit (HOSPITAL_COMMUNITY): Payer: Self-pay

## 2024-04-20 MED ORDER — MOUNJARO 5 MG/0.5ML ~~LOC~~ SOAJ
5.0000 mg | SUBCUTANEOUS | 3 refills | Status: DC
  Filled 2024-04-20: qty 2, 28d supply, fill #0
  Filled 2024-05-09 – 2024-05-16 (×2): qty 2, 28d supply, fill #1
  Filled 2024-06-13: qty 2, 28d supply, fill #2
  Filled 2024-07-10: qty 2, 28d supply, fill #3

## 2024-04-23 ENCOUNTER — Other Ambulatory Visit (HOSPITAL_COMMUNITY): Payer: Self-pay

## 2024-04-27 ENCOUNTER — Encounter: Admitting: Student

## 2024-04-30 ENCOUNTER — Other Ambulatory Visit: Payer: Self-pay

## 2024-04-30 ENCOUNTER — Other Ambulatory Visit (HOSPITAL_COMMUNITY): Payer: Self-pay

## 2024-05-01 ENCOUNTER — Other Ambulatory Visit (HOSPITAL_COMMUNITY): Payer: Self-pay

## 2024-05-01 ENCOUNTER — Encounter: Payer: Self-pay | Admitting: *Deleted

## 2024-05-04 ENCOUNTER — Other Ambulatory Visit (HOSPITAL_COMMUNITY): Payer: Self-pay

## 2024-05-09 ENCOUNTER — Other Ambulatory Visit (HOSPITAL_COMMUNITY): Payer: Self-pay

## 2024-05-09 ENCOUNTER — Other Ambulatory Visit: Payer: Self-pay

## 2024-05-11 ENCOUNTER — Other Ambulatory Visit (HOSPITAL_COMMUNITY): Payer: Self-pay

## 2024-05-11 ENCOUNTER — Encounter: Payer: Self-pay | Admitting: Pharmacist

## 2024-05-11 ENCOUNTER — Other Ambulatory Visit: Payer: Self-pay

## 2024-05-12 ENCOUNTER — Other Ambulatory Visit (HOSPITAL_COMMUNITY): Payer: Self-pay

## 2024-05-12 ENCOUNTER — Other Ambulatory Visit: Payer: Self-pay

## 2024-05-13 ENCOUNTER — Other Ambulatory Visit (HOSPITAL_COMMUNITY): Payer: Self-pay

## 2024-05-19 ENCOUNTER — Other Ambulatory Visit: Payer: Self-pay

## 2024-05-19 ENCOUNTER — Other Ambulatory Visit (HOSPITAL_COMMUNITY): Payer: Self-pay

## 2024-05-19 MED ORDER — GLUCOSE BLOOD VI STRP
ORAL_STRIP | 6 refills | Status: AC
  Filled 2024-05-19 – 2024-11-18 (×3): qty 100, 25d supply, fill #0

## 2024-05-20 ENCOUNTER — Encounter (HOSPITAL_COMMUNITY): Payer: Self-pay | Admitting: Pharmacist

## 2024-05-20 ENCOUNTER — Other Ambulatory Visit: Payer: Self-pay

## 2024-05-20 ENCOUNTER — Other Ambulatory Visit (HOSPITAL_COMMUNITY): Payer: Self-pay

## 2024-06-01 ENCOUNTER — Other Ambulatory Visit (HOSPITAL_COMMUNITY): Payer: Self-pay

## 2024-06-08 ENCOUNTER — Encounter: Admitting: Student

## 2024-06-15 ENCOUNTER — Ambulatory Visit: Admitting: Student

## 2024-06-15 VITALS — BP 124/89 | HR 83 | Temp 98.1°F | Ht 65.0 in | Wt 137.6 lb

## 2024-06-15 DIAGNOSIS — Z0000 Encounter for general adult medical examination without abnormal findings: Secondary | ICD-10-CM

## 2024-06-15 DIAGNOSIS — E669 Obesity, unspecified: Secondary | ICD-10-CM

## 2024-06-15 DIAGNOSIS — Z6822 Body mass index (BMI) 22.0-22.9, adult: Secondary | ICD-10-CM | POA: Diagnosis not present

## 2024-06-15 DIAGNOSIS — N920 Excessive and frequent menstruation with regular cycle: Secondary | ICD-10-CM | POA: Diagnosis not present

## 2024-06-15 DIAGNOSIS — Z86711 Personal history of pulmonary embolism: Secondary | ICD-10-CM

## 2024-06-15 DIAGNOSIS — Z7984 Long term (current) use of oral hypoglycemic drugs: Secondary | ICD-10-CM | POA: Diagnosis not present

## 2024-06-15 DIAGNOSIS — Z13220 Encounter for screening for lipoid disorders: Secondary | ICD-10-CM

## 2024-06-15 DIAGNOSIS — I1A0 Resistant hypertension: Secondary | ICD-10-CM

## 2024-06-15 DIAGNOSIS — E1169 Type 2 diabetes mellitus with other specified complication: Secondary | ICD-10-CM | POA: Diagnosis not present

## 2024-06-15 LAB — POCT GLYCOSYLATED HEMOGLOBIN (HGB A1C): Hemoglobin A1C: 5.5 % (ref 4.0–5.6)

## 2024-06-15 LAB — GLUCOSE, CAPILLARY: Glucose-Capillary: 105 mg/dL — ABNORMAL HIGH (ref 70–99)

## 2024-06-15 NOTE — Assessment & Plan Note (Addendum)
 A1c today is 5.5 (4.9 in 01/2024). After 4.9, Glargine was discontinued. Current management includes: Metformin  1000 mg twice daily and Mounjaro  5 mg weekly.  Will check A1c in 3 months and if they continue to be normal, we can look into reducing metformin .  She continues to focus on diet and exercise as well. -Check BMP -Microalbumin to Creatinine Ratio

## 2024-06-15 NOTE — Assessment & Plan Note (Signed)
 She states she had to get updated vaccinations for work and should be up-to-date on Tdap and hepatitis B vaccine.  She will work on getting paperwork for this and bring it at follow-up.

## 2024-06-15 NOTE — Assessment & Plan Note (Signed)
 2022 bilateral PE with evidence of right heart strain. Managed with Eliquis  5 mg BID. No concerns for LE swelling or SOB today.

## 2024-06-15 NOTE — Assessment & Plan Note (Addendum)
 LDL was 76 06/2023. Managed with Crestor  5 mg daily. Mildly above goal for primary prevention in a patient with history of T2DM. She also has a history of unprovoked PE. Will check lipid panel today and consider increasing Crestor  is above 70

## 2024-06-15 NOTE — Progress Notes (Signed)
 CC: Follow-up  HPI:  [name] is a 43 y.o. female living with a history stated below and presents today for follow-up. Please see problem based assessment and plan for additional details.  Past Medical History:  Diagnosis Date   Diabetes (HCC)    History of DVT (deep vein thrombosis)    Hypertension    Lymphadenitis 08/11/2021   Obesity (BMI 30-39.9)    Palpitations    Personal history of pulmonary embolism    Venous insufficiency     Current Outpatient Medications on File Prior to Visit  Medication Sig Dispense Refill   amLODipine  (NORVASC ) 5 MG tablet Take 1 tablet (5 mg total) by mouth daily. 90 tablet 3   apixaban  (ELIQUIS ) 5 MG TABS tablet Take 1 tablet (5 mg total) by mouth 2 (two) times daily. 180 tablet 3   carvedilol  (COREG ) 6.25 MG tablet Take 1 tablet (6.25 mg total) by mouth 2 (two) times daily with a meal. 180 tablet 3   chlorthalidone  (HYGROTON ) 25 MG tablet Take 1 tablet (25 mg total) by mouth daily. Please call our office to schedule an overdue appointment with Dr. Lonni before anymore refills. [phone]. 90 tablet 3   glucose blood test strip Use as instructed 100 each 12   glucose blood test strip Use as directed up to 4 times a day. 100 strip 6   Insulin  Pen Needle 32G X 4 MM MISC Use to inject as directed about once daily. Change needles with every use, 100 each 2   losartan  (COZAAR ) 100 MG tablet Take 1 tablet (100 mg total) by mouth daily. 90 tablet 3   metFORMIN  (GLUCOPHAGE ) 1000 MG tablet Take 1 tablet (1,000 mg total) by mouth 2 (two) times daily with a meal. 180 tablet 3   rosuvastatin  (CRESTOR ) 5 MG tablet Take 1 tablet (5 mg total) by mouth daily. 90 tablet 3   spironolactone  (ALDACTONE ) 25 MG tablet Take 1 tablet (25 mg total) by mouth daily. 90 tablet 3   tirzepatide  (MOUNJARO ) 5 MG/0.5ML Pen Inject 5 mg into the skin once a week. 2 mL 3   No current facility-administered medications on file prior to visit.    Family History   Problem Relation Age of Onset   Diabetes Mother    Heart attack Father 12   Breast cancer Paternal Grandmother    Heart attack Paternal Grandfather     Social History   Socioeconomic History   Marital status: Single    Spouse name: Not on file   Number of children: Not on file   Years of education: Not on file   Highest education level: Not on file  Occupational History   Not on file  Tobacco Use   Smoking status: Never    Passive exposure: Never   Smokeless tobacco: Never  Substance and Sexual Activity   Alcohol use: No   Drug use: No   Sexual activity: Not on file  Other Topics Concern   Not on file  Social History Narrative   Not on file   Social Drivers of Health   Financial Resource Strain: Medium Risk (06/15/2024)   Overall Financial Resource Strain (CARDIA)    Difficulty of Paying Living Expenses: Somewhat hard  Food Insecurity: No Food Insecurity (06/15/2024)   Hunger Vital Sign    Worried About Running Out of Food in the Last Year: Never true    Ran Out of Food in the Last Year: Never true  Transportation Needs: No Transportation Needs (06/15/2024)  PRAPARE - Administrator, Civil Service (Medical): No    Lack of Transportation (Non-Medical): No  Physical Activity: Insufficiently Active (06/15/2024)   Exercise Vital Sign    Days of Exercise per Week: 4 days    Minutes of Exercise per Session: 20 min  Stress: Stress Concern Present (06/15/2024)   [employer] of Occupational Health - Occupational Stress Questionnaire    Feeling of Stress: To some extent  Social Connections: Moderately Isolated (06/15/2024)   Social Connection and Isolation Panel    Frequency of Communication with Friends and Family: Once a week    Frequency of Social Gatherings with Friends and Family: More than three times a week    Attends Religious Services: More than 4 times per year    Active Member of [employer] or Organizations: No    Attends Banker Meetings:  Patient declined    Marital Status: Divorced  Catering manager Violence: Not At Risk (06/15/2024)   Humiliation, Afraid, Rape, and Kick questionnaire    Fear of Current or Ex-Partner: No    Emotionally Abused: No    Physically Abused: No    Sexually Abused: No    Review of Systems: ROS negative except for what is noted on the assessment and plan.  Vitals:   06/15/24 0929  BP: 124/89  Pulse: 83  Temp: 98.1 F (36.7 C)  TempSrc: Oral  SpO2: 98%  Weight: 137 lb 9.6 oz (62.4 kg)  Height: 5' 5 (1.651 m)    Physical Exam: Constitutional: well-appearing, sitting in chair, in no acute distress HENT: Conjunctiva without pallor Cardiovascular: regular rate and rhythm, no m/r/g Pulmonary/Chest: normal work of breathing on room air, lungs clear to auscultation bilaterally Abdominal: soft, non-tender, non-distended Skin: warm and dry Psych: normal mood and behavior  Assessment & Plan:     Patient discussed with Dr. Karna  Resistant hypertension Renin:Aldosterone wnl 2022. STOP-BANG score low at 2. Managed with amlodipine  5 mg daily, Coreg  6.25 twice daily, chlorthalidone  25 mg daily, losartan  100 mg daily, and spironolactone  25 mg daily. Normotensive today. Continue current management.   Type 2 diabetes mellitus with obesity (HCC) A1c today is 5.5 (4.9 in 01/2024). After 4.9, Glargine was discontinued. Current management includes: Metformin  1000 mg twice daily and Mounjaro  5 mg weekly.  Will check A1c in 3 months and if they continue to be normal, we can look into reducing metformin .  She continues to focus on diet and exercise as well. -Check BMP -Microalbumin to Creatinine Ratio  Screening for hyperlipidemia LDL was 76 06/2023. Managed with Crestor  5 mg daily. Mildly above goal for primary prevention in a patient with history of T2DM. She also has a history of unprovoked PE. 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 Managed with Eliquis  5 mg BID. No concerns for LE swelling or SOB today.  Menorrhagia Starting 7/17, she soaked through 5 pads for a few days. This was intermenstrual bleeding. This then slowed down considerably and the last few days she has had relatively normal menstruation but does endorse some mucus. Overall menstruation has been very regular in the past. She denies dysuria. Unclear if this is perimenopause related vs. Eliquis  vs. Fibroids, etc. Pap smear was normal in the past but she is due for one.  She does not follow-up with OB/GYN but I will send a referral today for Pap smear and potential  imaging if deemed necessary. Will check CBC.   Healthcare maintenance She states she had to get updated vaccinations for work and should be up-to-date on Tdap and hepatitis B vaccine.  She will work on getting paperwork for this and bring it at follow-up.   Norman Lobstein, D.O. [facility] Internal Medicine, PGY-2 Phone: [phone] Date 06/15/2024 Time 3:03 PM

## 2024-06-15 NOTE — Assessment & Plan Note (Addendum)
 Starting 7/17, she soaked through 5 pads for a few days. This was intermenstrual bleeding. This then slowed down considerably and the last few days she has had relatively normal menstruation but does endorse some mucus. Overall menstruation has been very regular in the past. She denies dysuria. Unclear if this is perimenopause related vs. Eliquis  vs. Fibroids, etc. Pap smear was normal in the past but she is due for one.  She does not follow-up with OB/GYN but I will send a referral today for Pap smear and potential imaging if deemed necessary. Will check CBC.

## 2024-06-15 NOTE — Assessment & Plan Note (Signed)
 Renin:Aldosterone wnl 2022. STOP-BANG score low at 2. Managed with amlodipine  5 mg daily, Coreg  6.25 twice daily, chlorthalidone  25 mg daily, losartan  100 mg daily, and spironolactone  25 mg daily. Normotensive today. Continue current management.

## 2024-06-16 LAB — CBC
Hematocrit: 37.3 % (ref 34.0–46.6)
Hemoglobin: 12.5 g/dL (ref 11.1–15.9)
MCH: 32.8 pg (ref 26.6–33.0)
MCHC: 33.5 g/dL (ref 31.5–35.7)
MCV: 98 fL — ABNORMAL HIGH (ref 79–97)
Platelets: 427 x10E3/uL (ref 150–450)
RBC: 3.81 x10E6/uL (ref 3.77–5.28)
RDW: 13 % (ref 11.7–15.4)
WBC: 7.8 x10E3/uL (ref 3.4–10.8)

## 2024-06-16 LAB — BASIC METABOLIC PANEL WITH GFR
BUN/Creatinine Ratio: 14 (ref 9–23)
BUN: 15 mg/dL (ref 6–24)
CO2: 23 mmol/L (ref 20–29)
Calcium: 10.7 mg/dL — ABNORMAL HIGH (ref 8.7–10.2)
Chloride: 95 mmol/L — ABNORMAL LOW (ref 96–106)
Creatinine, Ser: 1.09 mg/dL — ABNORMAL HIGH (ref 0.57–1.00)
Glucose: 98 mg/dL (ref 70–99)
Potassium: 4.3 mmol/L (ref 3.5–5.2)
Sodium: 133 mmol/L — ABNORMAL LOW (ref 134–144)
eGFR: 64 mL/min/1.73 (ref >59)

## 2024-06-16 LAB — LIPID PANEL
Chol/HDL Ratio: 2.8 ratio (ref 0.0–4.4)
Cholesterol, Total: 147 mg/dL (ref 100–199)
HDL: 53 mg/dL (ref >39)
LDL Chol Calc (NIH): 80 mg/dL (ref 0–99)
Triglycerides: 71 mg/dL (ref 0–149)
VLDL Cholesterol Cal: 14 mg/dL (ref 5–40)

## 2024-06-17 NOTE — Progress Notes (Signed)
 Internal Medicine Clinic Attending  Case discussed with the resident at the time of the visit.  We reviewed the resident's history and exam and pertinent patient test results.  I agree with the assessment, diagnosis, and plan of care documented in the resident's note.

## 2024-06-17 NOTE — Addendum Note (Signed)
 Addended by: KARNA FELLOWS on: 06/17/2024 10:32 AM   Modules accepted: Level of Service

## 2024-06-24 ENCOUNTER — Encounter: Payer: Self-pay | Admitting: Student

## 2024-06-24 ENCOUNTER — Ambulatory Visit: Payer: Self-pay | Admitting: Student

## 2024-06-24 ENCOUNTER — Other Ambulatory Visit: Payer: Self-pay | Admitting: Student

## 2024-06-24 DIAGNOSIS — E1169 Type 2 diabetes mellitus with other specified complication: Secondary | ICD-10-CM

## 2024-06-24 LAB — MICROALBUMIN / CREATININE URINE RATIO

## 2024-08-08 ENCOUNTER — Other Ambulatory Visit: Payer: Self-pay | Admitting: Student

## 2024-08-08 ENCOUNTER — Other Ambulatory Visit: Payer: Self-pay

## 2024-08-08 DIAGNOSIS — E669 Obesity, unspecified: Secondary | ICD-10-CM

## 2024-08-10 ENCOUNTER — Other Ambulatory Visit (HOSPITAL_COMMUNITY): Payer: Self-pay

## 2024-08-10 ENCOUNTER — Other Ambulatory Visit: Payer: Self-pay

## 2024-08-10 MED ORDER — METFORMIN HCL 1000 MG PO TABS
1000.0000 mg | ORAL_TABLET | Freq: Two times a day (BID) | ORAL | 3 refills | Status: AC
  Filled 2024-08-10: qty 180, 90d supply, fill #0
  Filled 2024-11-09: qty 180, 90d supply, fill #1

## 2024-08-10 NOTE — Telephone Encounter (Signed)
 Medication sent to pharmacy

## 2024-08-13 ENCOUNTER — Other Ambulatory Visit (HOSPITAL_COMMUNITY): Payer: Self-pay

## 2024-08-13 ENCOUNTER — Other Ambulatory Visit: Payer: Self-pay | Admitting: Student

## 2024-08-13 ENCOUNTER — Other Ambulatory Visit: Payer: Self-pay

## 2024-08-13 DIAGNOSIS — E1165 Type 2 diabetes mellitus with hyperglycemia: Secondary | ICD-10-CM

## 2024-08-13 MED ORDER — MOUNJARO 5 MG/0.5ML ~~LOC~~ SOAJ
5.0000 mg | SUBCUTANEOUS | 3 refills | Status: AC
  Filled 2024-08-13: qty 2, 28d supply, fill #0
  Filled 2024-09-16: qty 2, 28d supply, fill #1
  Filled 2024-10-20: qty 2, 28d supply, fill #2
  Filled 2024-11-13: qty 2, 28d supply, fill #3

## 2024-08-13 NOTE — Telephone Encounter (Signed)
 Medication sent to pharmacy

## 2024-09-02 ENCOUNTER — Encounter: Payer: Self-pay | Admitting: Student

## 2024-09-30 ENCOUNTER — Encounter

## 2024-09-30 ENCOUNTER — Other Ambulatory Visit

## 2024-10-13 ENCOUNTER — Encounter

## 2024-10-13 ENCOUNTER — Other Ambulatory Visit

## 2024-10-14 ENCOUNTER — Ambulatory Visit
Admission: RE | Admit: 2024-10-14 | Discharge: 2024-10-14 | Disposition: A | Source: Ambulatory Visit | Attending: Internal Medicine | Admitting: Internal Medicine

## 2024-10-14 DIAGNOSIS — N6489 Other specified disorders of breast: Secondary | ICD-10-CM

## 2024-10-14 DIAGNOSIS — R928 Other abnormal and inconclusive findings on diagnostic imaging of breast: Secondary | ICD-10-CM | POA: Diagnosis not present

## 2024-10-14 DIAGNOSIS — N6001 Solitary cyst of right breast: Secondary | ICD-10-CM | POA: Diagnosis not present

## 2024-10-20 ENCOUNTER — Other Ambulatory Visit: Payer: Self-pay | Admitting: Student

## 2024-10-20 ENCOUNTER — Other Ambulatory Visit (HOSPITAL_COMMUNITY): Payer: Self-pay

## 2024-10-20 ENCOUNTER — Other Ambulatory Visit: Payer: Self-pay

## 2024-10-20 DIAGNOSIS — I10 Essential (primary) hypertension: Secondary | ICD-10-CM

## 2024-10-20 MED ORDER — CHLORTHALIDONE 25 MG PO TABS
25.0000 mg | ORAL_TABLET | Freq: Every day | ORAL | 3 refills | Status: AC
  Filled 2024-10-20: qty 90, 90d supply, fill #0

## 2024-10-20 NOTE — Telephone Encounter (Signed)
 Medication sent to pharmacy

## 2024-10-26 ENCOUNTER — Other Ambulatory Visit: Payer: Self-pay | Admitting: Student

## 2024-10-26 ENCOUNTER — Other Ambulatory Visit: Payer: Self-pay

## 2024-10-26 DIAGNOSIS — E1165 Type 2 diabetes mellitus with hyperglycemia: Secondary | ICD-10-CM

## 2024-10-26 DIAGNOSIS — I1A0 Resistant hypertension: Secondary | ICD-10-CM

## 2024-10-26 DIAGNOSIS — I159 Secondary hypertension, unspecified: Secondary | ICD-10-CM

## 2024-10-26 MED ORDER — AMLODIPINE BESYLATE 5 MG PO TABS
5.0000 mg | ORAL_TABLET | Freq: Every day | ORAL | 3 refills | Status: AC
  Filled 2024-10-26: qty 90, 90d supply, fill #0

## 2024-10-26 MED ORDER — ROSUVASTATIN CALCIUM 5 MG PO TABS
5.0000 mg | ORAL_TABLET | Freq: Every day | ORAL | 3 refills | Status: AC
  Filled 2024-10-26: qty 90, 90d supply, fill #0

## 2024-10-26 MED ORDER — LOSARTAN POTASSIUM 100 MG PO TABS
100.0000 mg | ORAL_TABLET | Freq: Every day | ORAL | 3 refills | Status: AC
  Filled 2024-10-26: qty 90, 90d supply, fill #0

## 2024-10-26 MED ORDER — CARVEDILOL 6.25 MG PO TABS
6.2500 mg | ORAL_TABLET | Freq: Two times a day (BID) | ORAL | 3 refills | Status: AC
  Filled 2024-10-26: qty 180, 90d supply, fill #0

## 2024-10-26 MED ORDER — SPIRONOLACTONE 25 MG PO TABS
25.0000 mg | ORAL_TABLET | Freq: Every day | ORAL | 3 refills | Status: AC
  Filled 2024-10-26: qty 90, 90d supply, fill #0

## 2024-10-26 NOTE — Telephone Encounter (Signed)
 Medication sent to pharmacy

## 2024-11-09 ENCOUNTER — Other Ambulatory Visit: Payer: Self-pay

## 2024-11-10 ENCOUNTER — Other Ambulatory Visit: Payer: Self-pay

## 2024-11-17 ENCOUNTER — Ambulatory Visit: Payer: Self-pay | Admitting: Student

## 2024-11-18 ENCOUNTER — Other Ambulatory Visit: Payer: Self-pay | Admitting: Student

## 2024-11-18 ENCOUNTER — Other Ambulatory Visit (HOSPITAL_COMMUNITY): Payer: Self-pay

## 2024-11-18 DIAGNOSIS — I159 Secondary hypertension, unspecified: Secondary | ICD-10-CM

## 2024-11-18 DIAGNOSIS — I2609 Other pulmonary embolism with acute cor pulmonale: Secondary | ICD-10-CM

## 2024-11-18 MED ORDER — APIXABAN 5 MG PO TABS
5.0000 mg | ORAL_TABLET | Freq: Two times a day (BID) | ORAL | 3 refills | Status: AC
  Filled 2024-11-18: qty 180, 90d supply, fill #0

## 2024-12-24 ENCOUNTER — Ambulatory Visit: Payer: Self-pay
